# Patient Record
Sex: Female | Born: 1955 | Race: White | Hispanic: No | Marital: Married | Smoking: Former smoker
Health system: Southern US, Community
[De-identification: ages and names within clinical notes are randomized; demographics above are authoritative.]

## PROBLEM LIST (undated history)

## (undated) DIAGNOSIS — R42 Dizziness and giddiness: Secondary | ICD-10-CM

## (undated) DIAGNOSIS — M1990 Unspecified osteoarthritis, unspecified site: Secondary | ICD-10-CM

## (undated) HISTORY — PX: DILATION AND CURETTAGE OF UTERUS: SHX78

## (undated) HISTORY — PX: APPENDECTOMY: SHX54

---

## 2004-08-22 ENCOUNTER — Emergency Department: Payer: Self-pay | Admitting: Emergency Medicine

## 2006-06-22 ENCOUNTER — Ambulatory Visit: Payer: Self-pay | Admitting: Nurse Practitioner

## 2007-07-02 ENCOUNTER — Ambulatory Visit: Payer: Self-pay | Admitting: Surgery

## 2008-11-23 ENCOUNTER — Emergency Department: Payer: Self-pay | Admitting: Emergency Medicine

## 2009-12-15 ENCOUNTER — Inpatient Hospital Stay: Payer: Self-pay | Admitting: Surgery

## 2015-03-06 ENCOUNTER — Emergency Department
Admission: EM | Admit: 2015-03-06 | Discharge: 2015-03-06 | Disposition: A | Discharge status: Home / Self Care | Payer: Self-pay | Attending: Emergency Medicine | Admitting: Emergency Medicine

## 2015-03-06 ENCOUNTER — Other Ambulatory Visit: Payer: Self-pay

## 2015-03-06 ENCOUNTER — Encounter: Payer: Self-pay | Admitting: Emergency Medicine

## 2015-03-06 DIAGNOSIS — M549 Dorsalgia, unspecified: Secondary | ICD-10-CM | POA: Insufficient documentation

## 2015-03-06 DIAGNOSIS — M5412 Radiculopathy, cervical region: Secondary | ICD-10-CM | POA: Insufficient documentation

## 2015-03-06 DIAGNOSIS — Z87891 Personal history of nicotine dependence: Secondary | ICD-10-CM | POA: Insufficient documentation

## 2015-03-06 DIAGNOSIS — R200 Anesthesia of skin: Secondary | ICD-10-CM | POA: Insufficient documentation

## 2015-03-06 LAB — TROPONIN I: Troponin I: <0.03 ng/mL (ref <0.031)

## 2015-03-06 MED ORDER — DEXAMETHASONE SODIUM PHOSPHATE 10 MG/ML IJ SOLN
10.0000 mg | Freq: Once | INTRAMUSCULAR | Status: AC
  Administered 2015-03-06: 10 mg via INTRAMUSCULAR

## 2015-03-06 MED ORDER — OXYCODONE-ACETAMINOPHEN 5-325 MG PO TABS: 1.0000 | ORAL_TABLET | Freq: Four times a day (QID) | ORAL | Status: DC | PRN

## 2015-03-06 MED ORDER — KETOROLAC TROMETHAMINE 30 MG/ML IJ SOLN
INTRAMUSCULAR | Status: AC
  Administered 2015-03-06: 30 mg via INTRAMUSCULAR
  Filled 2015-03-06: qty 1

## 2015-03-06 MED ORDER — PREDNISONE 10 MG PO TABS: 10.0000 mg | ORAL_TABLET | Freq: Every day | ORAL | Status: DC

## 2015-03-06 MED ORDER — KETOROLAC TROMETHAMINE 30 MG/ML IJ SOLN
30.0000 mg | Freq: Once | INTRAMUSCULAR | Status: AC
  Administered 2015-03-06: 30 mg via INTRAMUSCULAR

## 2015-03-06 MED ORDER — DEXAMETHASONE SODIUM PHOSPHATE 10 MG/ML IJ SOLN
INTRAMUSCULAR | Status: AC
Start: 2015-03-06 — End: 2015-03-06
  Administered 2015-03-06: 10 mg via INTRAMUSCULAR
  Filled 2015-03-06: qty 1

## 2015-03-06 NOTE — ED Provider Notes (Signed)
CSN: [id number]     Arrival date & time 03/06/15  2044 History   First MD Initiated Contact with Patient 03/06/15 2222     Chief Complaint  Patient presents with  . Back Pain  . Shoulder Pain      HPI  59 year old female with 1 month of left shoulder pain. She describes pain numbness and tingling radiating from the left shoulder blade down the left arm and into the left hand. Pain is constant and increased with activity. She notices increased pain with cervical extension. He cervical flexion. Pain is moderate. She has tried ibuprofen, Percocet with no relief. Denies any trauma injury or weakness in the right upper extremity. No relief with heating pad.  No past medical history on file. Past Surgical History  Procedure Laterality Date  . Appendectomy     No family history on file. History  Substance Use Topics  . Smoking status: Former Games developermoker  . Smokeless tobacco: Not on file  . Alcohol Use: No   OB History    No data available     Review of Systems  Constitutional: Negative for fever, chills, activity change, appetite change and fatigue.  HENT: Negative for congestion, facial swelling, mouth sores, nosebleeds, rhinorrhea, sinus pressure, sore throat and trouble swallowing.   Eyes: Negative for discharge, redness and visual disturbance.  Respiratory: Negative for cough, chest tightness, shortness of breath, wheezing and stridor.   Cardiovascular: Negative for chest pain and leg swelling.  Gastrointestinal: Negative for nausea, vomiting, abdominal pain and diarrhea.  Genitourinary: Negative for dysuria, flank pain and difficulty urinating.  Musculoskeletal: Negative for arthralgias and gait problem.  Neurological: Positive for numbness (left upper extremity, all 5 digits, unable to specify a specific nerve distribution). Negative for weakness and headaches.  Hematological: Negative for adenopathy.  Psychiatric/Behavioral: Negative for behavioral problems, confusion and  agitation.      Allergies  Review of patient's allergies indicates no known allergies.  Home Medications   Prior to Admission medications   Medication Sig Start Date End Date Taking? Authorizing Provider  oxyCODONE-acetaminophen (ROXICET) 5-325 MG per tablet Take 1-2 tablets by mouth every 6 (six) hours as needed for severe pain. 03/06/15   Evon Slackhomas C Gaines, PA-C  predniSONE (DELTASONE) 10 MG tablet Take 1 tablet (10 mg total) by mouth daily. 03/06/15   Evon Slackhomas C Gaines, PA-C   BP 173/95 mmHg  Pulse 81  Temp(Src) 97.7 F (36.5 C) (Oral)  Resp 14  Ht 6\' 6"  (1.981 m)  SpO2 100% Physical Exam  Constitutional: She is oriented to person, place, and time. She appears well-developed and well-nourished.  HENT:  Head: Normocephalic and atraumatic.  Eyes: EOM are normal. Pupils are equal, round, and reactive to light.  Neck: Normal range of motion. Neck supple.  Positive Spurling's test to the left  Cardiovascular: Normal rate, regular rhythm and normal heart sounds.   Pulmonary/Chest: Effort normal and breath sounds normal. No respiratory distress.  Abdominal: Soft. She exhibits no distension.  Musculoskeletal: Normal range of motion. She exhibits no edema or tenderness.  Patient with normal range of motion of left upper extremity at the shoulder or elbow wrist and digits. She had 5 out 5 strength with biceps, triceps and grip strength. She is neurovascularly intact to left upper extremity. Negative Tinel's and Phalen sign at the wrist.  Lymphadenopathy:    She has no cervical adenopathy.  Neurological: She is alert and oriented to person, place, and time.  Skin: Skin is warm and dry.  Psychiatric: She has a normal mood and affect. Her behavior is normal.    ED Course  Procedures (including critical care time) Labs Review Labs Reviewed  TROPONIN I    Imaging Review No results found.   ED ECG REPORT I, KAMINSKI,DAVID W, the attending physician, personally viewed and interpreted  this ECG.  Date: 03/06/2015 EKG Time: 2106 Rate: 79 Rhythm: Normal sinus rhythm with one premature beat. Axis: 35 Intervals: Within normal limits ST&T Change: None noted  MDM   Final diagnoses:  Acute cervical radiculopathy    Patient with 1 month of left shoulder blade pain, pain numbness tingling and burning sensation throughout the left upper extremity. She has a positive Spurling's test to the left. No Neurological deficits are noted. Chest tried oral NSAIDs with no relief. He was given an dexamethasone injection 10 mg, Toradol injection 30 mg. He was sent home with oxycodone quantity #25, prednisone 6 day taper. She'll follow up with orthopedics in no relief. Patient educated on red flags follow-up for any worsening symptoms or urgency changes in her health.    Evon Slackhomas C Gaines, PA-C 03/06/15 2252  Darien Ramusavid W Kaminski, MD 03/06/15 [id number]

## 2015-03-06 NOTE — Discharge Instructions (Signed)

## 2015-03-06 NOTE — ED Notes (Addendum)
Pt states left posterior shoulder pain that radiates to upper back for one month. Pt states pain is worse this last day. Pt states was nauseated "a couple of days ago after i took someone's pain pill". Pt also complains of tingling and pain in left upper extremity. Pt denies chest pain, shob, dizziness, diaphoresis. Pain is worse with movement and palpation.

## 2015-03-06 NOTE — ED Notes (Signed)
Pt med hold until 2345.

## 2015-03-06 NOTE — ED Provider Notes (Signed)
ED ECG REPORT I, Breeona Waid W, the attending physician, personally viewed and interpreted this ECG.   Date: 03/06/2015  EKG Time: 2106  Rate: 79  Rhythm: Normal sinus rhythm with one premature beat.  Axis: 35  Intervals: Within normal limits  ST&T Change: None noted   Darien Ramusavid W Ewel Lona, MD 03/06/15 2237

## 2015-04-20 ENCOUNTER — Emergency Department
Admission: EM | Admit: 2015-04-20 | Discharge: 2015-04-20 | Disposition: A | Discharge status: Home / Self Care | Payer: Self-pay | Attending: Emergency Medicine | Admitting: Emergency Medicine

## 2015-04-20 DIAGNOSIS — M79609 Pain in unspecified limb: Secondary | ICD-10-CM

## 2015-04-20 DIAGNOSIS — R202 Paresthesia of skin: Secondary | ICD-10-CM | POA: Insufficient documentation

## 2015-04-20 DIAGNOSIS — M5412 Radiculopathy, cervical region: Secondary | ICD-10-CM | POA: Insufficient documentation

## 2015-04-20 DIAGNOSIS — Z87891 Personal history of nicotine dependence: Secondary | ICD-10-CM | POA: Insufficient documentation

## 2015-04-20 MED ORDER — DIAZEPAM 2 MG PO TABS: 2.0000 mg | ORAL_TABLET | Freq: Three times a day (TID) | ORAL | Status: DC | PRN

## 2015-04-20 MED ORDER — OXYCODONE-ACETAMINOPHEN 5-325 MG PO TABS: 1.0000 | ORAL_TABLET | Freq: Three times a day (TID) | ORAL | Status: DC | PRN

## 2015-04-20 MED ORDER — PREDNISONE 10 MG PO TABS: 10.0000 mg | ORAL_TABLET | Freq: Two times a day (BID) | ORAL | Status: DC

## 2015-04-20 NOTE — ED Notes (Signed)
Pt c/o left shoulder pain that radiates into the hand with tingling for almost 2 months, was seen here in the ED and dx with radiculopathy and referred to ortho, states she has not called for follow up

## 2015-04-20 NOTE — ED Provider Notes (Signed)
[facility] Emergency Department Provider Note ____________________________________________  Time seen: 2022  I have reviewed the triage vital signs and the nursing notes.  HISTORY  Chief Complaint  Shoulder Pain  HPI Felicia Dunn is a 59 y.o. female, right hand dominant, who reports to the ED with continued pain in the left upper extremity. She was last evaluated here about 6 weeks ago, and diagnosed with a cervical radiculopathy on the left side. She was referred to Dr. Rexanne Mano, but has failed to follow-up in the interim. She has since completed her previous prednisone and Percocet prescriptions. She denies reinjury, trauma, or weakness. She localizes pain just at the base of the neck just medial to the shoulder blade on the left with referral into the upper extremity past the elbow and into the fingers. She is claiming that she needs to know what's wrong because the last provider failed to do any x-rays. She rates her pain at a 10 out of 10 in triage.  History reviewed. No pertinent past medical history.  There are no active problems to display for this patient.  Past Surgical History  Procedure Laterality Date  . Appendectomy      Current Outpatient Rx  Name  Route  Sig  Dispense  Refill  . diazepam (VALIUM) 2 MG tablet   Oral   Take 1 tablet (2 mg total) by mouth every 8 (eight) hours as needed for muscle spasms.   10 tablet   0   . oxyCODONE-acetaminophen (ROXICET) 5-325 MG per tablet   Oral   Take 1 tablet by mouth every 8 (eight) hours as needed.   21 tablet   0   . predniSONE (DELTASONE) 10 MG tablet   Oral   Take 1 tablet (10 mg total) by mouth 2 (two) times daily with a meal.   14 tablet   0     Allergies Review of patient's allergies indicates no known allergies.  No family history on file.  Social History History  Substance Use Topics  . Smoking status: Former Games developer  . Smokeless tobacco: Never Used  . Alcohol Use: No     Review of Systems  Constitutional: Negative for fever. Eyes: Negative for visual changes. ENT: Negative for sore throat. Cardiovascular: Negative for chest pain. Respiratory: Negative for shortness of breath. Gastrointestinal: Negative for abdominal pain, vomiting and diarrhea. Genitourinary: Negative for dysuria. Musculoskeletal: Negative for back pain. Skin: Negative for rash. Neurological: Negative for headaches or focal weakness. Reports numbness as above. ____________________________________________  PHYSICAL EXAM:  VITAL SIGNS: ED Triage Vitals  Enc Vitals Group     BP 04/20/15 1852 158/83 mmHg     Pulse Rate 04/20/15 1852 88     Resp 04/20/15 1852 18     Temp 04/20/15 1852 98.2 F (36.8 C)     Temp Source 04/20/15 1852 Oral     SpO2 04/20/15 1852 96 %     Weight --      Height 04/20/15 1852  (1.981 m)     Head Cir --      Peak Flow --      Pain Score 04/20/15 1853 8     Pain Loc --      Pain Edu? --      Excl. in GC? --    Constitutional: Alert and oriented. Well appearing and in no distress. Eyes: Conjunctivae are normal. PERRL. Normal extraocular movements. ENT   Head: Normocephalic and atraumatic.   Nose: No congestion/rhinnorhea.  Mouth/Throat: Mucous membranes are moist.   Neck: Supple. No thyromegaly. Hematological/Lymphatic/Immunilogical: No cervical lymphadenopathy. Cardiovascular: Normal rate, regular rhythm.  Respiratory: Normal respiratory effort.  Musculoskeletal: Nontender with normal range of motion in all extremities. Fulll UE ROM without difficulty. Neurologic:  Normal gait without ataxia. Normal speech and language. No gross focal neurologic deficits are appreciated. CN II-XII grossly intact. Normal grip, intrinsic, and opposition testing.  Skin:  Skin is warm, dry and intact. No rash noted. Psychiatric: Mood and affect are normal. Patient exhibits appropriate insight and  judgment. ____________________________________________  INITIAL IMPRESSION / ASSESSMENT AND PLAN / ED COURSE  Discussed the need to see ortho for definitive treatment of her clinical presentation of cervical radicular pain. Refills of Percocet #21 and Prednisone BID #14 given. Prescription Valium 2 mg #10 given for muscle pain & spasms.  ____________________________________________  FINAL CLINICAL IMPRESSION(S) / ED DIAGNOSES  Final diagnoses:  Cervical radicular pain  Paresthesia and pain of left extremity     Lissa HoardJenise V Bacon Neya Creegan, PA-C 04/20/15 2054  Darien Ramusavid W Kaminski, MD 04/20/15 [id number]

## 2015-04-20 NOTE — Discharge Instructions (Signed)
Cervical Radiculopathy Cervical radiculopathy means a nerve in the neck is pinched or bruised. This can cause pain or loss of feeling (numbness) that runs from your neck to your arm and fingers. HOME CARE   Put ice on the injured or painful area.  Put ice in a plastic bag.  Place a towel between your skin and the bag.  Leave the ice on for 15-20 minutes, 03-04 times a day, or as told by your doctor.  If ice does not help, you can try using heat. Take a warm shower or bath, or use a hot water bottle as told by your doctor.  You may try a gentle neck and shoulder massage.  Use a flat pillow when you sleep.  Only take medicines as told by your doctor.  Keep all physical therapy visits as told by your doctor.  If you are given a soft collar, wear it as told by your doctor. GET HELP RIGHT AWAY IF:   Your pain gets worse and is not controlled with medicine.  You lose feeling or feel weak in your hand, arm, face, or leg.  You have a fever or stiff neck.  You cannot control when you poop or pee (incontinence).  You have trouble with walking, balance, or speaking. MAKE SURE YOU:   Understand these instructions.  Will watch your condition.  Will get help right away if you are not doing well or get worse. Document Released: 09/22/2011 Document Revised: 12/26/2011 Document Reviewed: 09/22/2011 [facility] Patient Information 2015 [place], [place]. This information is not intended to replace advice given to you by your health care provider. Make sure you discuss any questions you have with your health care provider.   Radicular Pain Radicular pain in either the arm or leg is usually from a bulging or herniated disk in the spine. A piece of the herniated disk may press against the nerves as the nerves exit the spine. This causes pain which is felt at the tips of the nerves down the arm or leg. Other causes of radicular pain may include:  Fractures.  Heart disease.  Cancer.  An  abnormal and usually degenerative state of the nervous system or nerves (neuropathy). Diagnosis may require CT or MRI scanning to determine the primary cause.  Nerves that start at the neck (nerve roots) may cause radicular pain in the outer shoulder and arm. It can spread down to the thumb and fingers. The symptoms vary depending on which nerve root has been affected. In most cases radicular pain improves with conservative treatment. Neck problems may require physical therapy, a neck collar, or cervical traction. Treatment may take many weeks, and surgery may be considered if the symptoms do not improve.  Conservative treatment is also recommended for sciatica. Sciatica causes pain to radiate from the lower back or buttock area down the leg into the foot. Often there is a history of back problems. Most patients with sciatica are better after 2 to 4 weeks of rest and other supportive care. Short term bed rest can reduce the disk pressure considerably. Sitting, however, is not a good position since this increases the pressure on the disk. You should avoid bending, lifting, and all other activities which make the problem worse. Traction can be used in severe cases. Surgery is usually reserved for patients who do not improve within the first months of treatment. Only take over-the-counter or prescription medicines for pain, discomfort, or fever as directed by your caregiver. Narcotics and muscle relaxants may help  by relieving more severe pain and spasm and by providing mild sedation. Cold or massage can give significant relief. Spinal manipulation is not recommended. It can increase the degree of disc protrusion. Epidural steroid injections are often effective treatment for radicular pain. These injections deliver medicine to the spinal nerve in the space between the protective covering of the spinal cord and back bones (vertebrae). Your caregiver can give you more information about steroid injections. These  injections are most effective when given within two weeks of the onset of pain.  You should see your caregiver for follow up care as recommended. A program for neck and back injury rehabilitation with stretching and strengthening exercises is an important part of management.  SEEK IMMEDIATE MEDICAL CARE IF:  You develop increased pain, weakness, or numbness in your arm or leg.  You develop difficulty with bladder or bowel control.  You develop abdominal pain. Document Released: 11/10/2004 Document Revised: 12/26/2011 Document Reviewed: 01/26/2009 [facility] Patient Information 2015 [place]. This information is not intended to replace advice given to you by your health care provider. Make sure you discuss any questions you have with your health care provider.  Take the prescription meds as directed.  [facility]  You should see Dr. Rosita KeaMenz for definitive treatment of your pinched nerves.

## 2016-09-05 ENCOUNTER — Encounter: Payer: Self-pay | Admitting: *Deleted

## 2016-09-06 NOTE — Discharge Instructions (Signed)
Cataract Surgery, Care After °Refer to this sheet in the next few weeks. These instructions provide you with information about caring for yourself after your procedure. Your health care provider may also give you more specific instructions. Your treatment has been planned according to current medical practices, but problems sometimes occur. Call your health care provider if you have any problems or questions after your procedure. °What can I expect after the procedure? °After the procedure, it is common to have: °· Itching. °· Discomfort. °· Fluid discharge. °· Sensitivity to light and to touch. °· Bruising. °Follow these instructions at home: °Eye Care  °· Check your eye every day for signs of infection. Watch for: °¨ Redness, swelling, or pain. °¨ Fluid, blood, or pus. °¨ Warmth. °¨ Bad smell. °Activity  °· Avoid strenuous activities, such as playing contact sports, for as long as told by your health care provider. °· Do not drive or operate heavy machinery until your health care provider approves. °· Do not bend or lift heavy objects . Bending increases pressure in the eye. You can walk, climb stairs, and do light household chores. °· Ask your health care provider when you can return to work. If you work in a dusty environment, you may be advised to wear protective eyewear for a period of time. °General instructions  °· Take or apply over-the-counter and prescription medicines only as told by your health care provider. This includes eye drops. °· Do not touch or rub your eyes. °· If you were given a protective shield, wear it as told by your health care provider. If you were not given a protective shield, wear sunglasses as told by your health care provider to protect your eyes. °· Keep the area around your eye clean and dry. Avoid swimming or allowing water to hit you directly in the face while showering until told by your health care provider. Keep soap and shampoo out of your eyes. °· Do not put a contact lens  into the affected eye or eyes until your health care provider approves. °· Keep all follow-up visits as told by your health care provider. This is important. °Contact a health care provider if: ° °· You have increased bruising around your eye. °· You have pain that is not helped with medicine. °· You have a fever. °· You have redness, swelling, or pain in your eye. °· You have fluid, blood, or pus coming from your incision. °· Your vision gets worse. °Get help right away if: °· You have sudden vision loss. °This information is not intended to replace advice given to you by your health care provider. Make sure you discuss any questions you have with your health care provider. °Document Released: 04/22/2005 Document Revised: 02/11/2016 Document Reviewed: 08/13/2015 °Elsevier Interactive Patient Education © 2017 [employer] Inc[employer] ° ° ° ° °General Anesthesia, Adult, Care After °These instructions provide you with information about caring for yourself after your procedure. Your health care provider may also give you more specific instructions. Your treatment has been planned according to current medical practices, but problems sometimes occur. Call your health care provider if you have any problems or questions after your procedure. °What can I expect after the procedure? °After the procedure, it is common to have: °· Vomiting. °· A sore throat. °· Mental slowness. °It is common to feel: °· Nauseous. °· Cold or shivery. °· Sleepy. °· Tired. °· Sore or achy, even in parts of your body where you did not have surgery. °Follow these instructions at   home: °For at least 24 hours after the procedure:  °· Do not: °¨ Participate in activities where you could fall or become injured. °¨ Drive. °¨ Use heavy machinery. °¨ Drink alcohol. °¨ Take sleeping pills or medicines that cause drowsiness. °¨ Make important decisions or sign legal documents. °¨ Take care of children on your own. °· Rest. °Eating and drinking  °· If you vomit, drink  water, juice, or soup when you can drink without vomiting. °· Drink enough fluid to keep your urine clear or pale yellow. °· Make sure you have little or no nausea before eating solid foods. °· Follow the diet recommended by your health care provider. °General instructions  °· Have a responsible adult stay with you until you are awake and alert. °· Return to your normal activities as told by your health care provider. Ask your health care provider what activities are safe for you. °· Take over-the-counter and prescription medicines only as told by your health care provider. °· If you smoke, do not smoke without supervision. °· Keep all follow-up visits as told by your health care provider. This is important. °Contact a health care provider if: °· You continue to have nausea or vomiting at home, and medicines are not helpful. °· You cannot drink fluids or start eating again. °· You cannot urinate after 8-12 hours. °· You develop a skin rash. °· You have fever. °· You have increasing redness at the site of your procedure. °Get help right away if: °· You have difficulty breathing. °· You have chest pain. °· You have unexpected bleeding. °· You feel that you are having a life-threatening or urgent problem. °This information is not intended to replace advice given to you by your health care provider. Make sure you discuss any questions you have with your health care provider. °Document Released: 01/09/2001 Document Revised: 03/07/2016 Document Reviewed: 09/17/2015 °Elsevier Interactive Patient Education © 2017 Elsevier Inc. ° °

## 2016-09-13 ENCOUNTER — Ambulatory Visit: Payer: Self-pay | Admitting: Anesthesiology

## 2016-09-13 ENCOUNTER — Ambulatory Visit
Admission: RE | Admit: 2016-09-13 | Discharge: 2016-09-13 | Disposition: A | Discharge status: Home / Self Care | Payer: Self-pay | Source: Ambulatory Visit | Attending: Ophthalmology | Admitting: Ophthalmology

## 2016-09-13 ENCOUNTER — Encounter
Admission: RE | Disposition: A | Discharge status: Home / Self Care | Payer: Self-pay | Source: Ambulatory Visit | Attending: Ophthalmology

## 2016-09-13 DIAGNOSIS — Z87891 Personal history of nicotine dependence: Secondary | ICD-10-CM | POA: Insufficient documentation

## 2016-09-13 DIAGNOSIS — H2512 Age-related nuclear cataract, left eye: Secondary | ICD-10-CM | POA: Insufficient documentation

## 2016-09-13 DIAGNOSIS — M1990 Unspecified osteoarthritis, unspecified site: Secondary | ICD-10-CM | POA: Insufficient documentation

## 2016-09-13 HISTORY — PX: CATARACT EXTRACTION W/PHACO: SHX586

## 2016-09-13 HISTORY — DX: Unspecified osteoarthritis, unspecified site: M19.90

## 2016-09-13 HISTORY — DX: Dizziness and giddiness: R42

## 2016-09-13 SURGERY — PHACOEMULSIFICATION, CATARACT, WITH IOL INSERTION
Anesthesia: Monitor Anesthesia Care | Laterality: Left | Wound class: Clean

## 2016-09-13 MED ORDER — MOXIFLOXACIN HCL 0.5 % OP SOLN
OPHTHALMIC | Status: DC | PRN
  Administered 2016-09-13: 1 [drp] via OPHTHALMIC

## 2016-09-13 MED ORDER — SODIUM HYALURONATE 10 MG/ML IO SOLN
INTRAOCULAR | Status: DC | PRN
  Administered 2016-09-13: 0.85 mL via INTRAOCULAR

## 2016-09-13 MED ORDER — FENTANYL CITRATE (PF) 100 MCG/2ML IJ SOLN
INTRAMUSCULAR | Status: DC | PRN
  Administered 2016-09-13 (×2): 50 ug via INTRAVENOUS

## 2016-09-13 MED ORDER — EPINEPHRINE PF 1 MG/ML IJ SOLN
INTRAOCULAR | Status: DC | PRN
  Administered 2016-09-13: 111 mL via OPHTHALMIC

## 2016-09-13 MED ORDER — LIDOCAINE HCL (PF) 4 % IJ SOLN
INTRAOCULAR | Status: DC | PRN
  Administered 2016-09-13: 1 mL via OPHTHALMIC

## 2016-09-13 MED ORDER — ARMC OPHTHALMIC DILATING DROPS
1.0000 "application " | OPHTHALMIC | Status: DC | PRN
  Administered 2016-09-13 (×3): 1 via OPHTHALMIC

## 2016-09-13 MED ORDER — SODIUM HYALURONATE 23 MG/ML IO SOLN
INTRAOCULAR | Status: DC | PRN
  Administered 2016-09-13: 0.6 mL via INTRAOCULAR

## 2016-09-13 MED ORDER — MIDAZOLAM HCL 2 MG/2ML IJ SOLN
INTRAMUSCULAR | Status: DC | PRN
  Administered 2016-09-13: 2 mg via INTRAVENOUS

## 2016-09-13 SURGICAL SUPPLY — 19 items
CANNULA ANT/CHMB 27GA (MISCELLANEOUS) ×2 IMPLANT
CUP MEDICINE 2OZ PLAST GRAD ST (MISCELLANEOUS) ×2 IMPLANT
DISSECTOR HYDRO NUCLEUS 50X22 (MISCELLANEOUS) ×2 IMPLANT
GLOVE BIO SURGEON STRL SZ8 (GLOVE) ×2 IMPLANT
GLOVE SURG LX 7.5 STRW (GLOVE) ×1
GLOVE SURG LX STRL 7.5 STRW (GLOVE) ×1 IMPLANT
GOWN STRL REUS W/ TWL LRG LVL3 (GOWN DISPOSABLE) ×2 IMPLANT
GOWN STRL REUS W/TWL LRG LVL3 (GOWN DISPOSABLE) ×2
LENS IOL TECNIS SYMFONY 19.0 (Intraocular Lens) ×2 IMPLANT
MARKER SKIN DUAL TIP RULER LAB (MISCELLANEOUS) ×2 IMPLANT
PACK CATARACT (MISCELLANEOUS) ×2 IMPLANT
PACK CATARACT BRASINGTON (MISCELLANEOUS) ×2 IMPLANT
PACK EYE AFTER SURG (MISCELLANEOUS) ×2 IMPLANT
SOL PREP PVP 2OZ (MISCELLANEOUS) ×2
SOLUTION PREP PVP 2OZ (MISCELLANEOUS) ×1 IMPLANT
SYR 3ML LL SCALE MARK (SYRINGE) ×2 IMPLANT
SYR TB 1ML LUER SLIP (SYRINGE) ×2 IMPLANT
WATER STERILE IRR 250ML POUR (IV SOLUTION) ×2 IMPLANT
WIPE NON LINTING 3.25X3.25 (MISCELLANEOUS) ×2 IMPLANT

## 2016-09-13 NOTE — Op Note (Signed)
OPERATIVE NOTE  Felicia Dunn [id number] 09/13/2016   PREOPERATIVE DIAGNOSIS:  Nuclear sclerotic cataract left eye.  H25.12   POSTOPERATIVE DIAGNOSIS:    Nuclear sclerotic cataract left eye.     PROCEDURE:  Phacoemusification with posterior chamber intraocular lens placement of the left eye   LENS:   Implant Name Type Inv. Item Serial No. Manufacturer Lot No. LRB No. Used  Symfony IOL lens     [id number] [employer]   Left 1       [id number] +19.0 D multifocal IOL   ULTRASOUND TIME: 0 minutes 28 seconds.  CDE 4.13   SURGEON:  Willey BladeBradley Shonn Farruggia, MD, MPH   ANESTHESIA:  Topical with tetracaine drops augmented with 1% preservative-free intracameral lidocaine.   COMPLICATIONS:  None.   DESCRIPTION OF PROCEDURE:  The patient was identified in the holding room and transported to the operating room and placed in the supine position under the operating microscope.  The left eye was identified as the operative eye and it was prepped and draped in the usual sterile ophthalmic fashion.   A 1.0 millimeter clear-corneal paracentesis was made at the 5:00 position. 0.5 ml of preservative-free 1% lidocaine with epinephrine was injected into the anterior chamber.  The anterior chamber was filled with Healon 5 viscoelastic.  A 2.4 millimeter keratome was used to make a near-clear corneal incision at the 2:00 position.  A curvilinear capsulorrhexis was made with a cystotome and capsulorrhexis forceps.  Balanced salt solution was used to hydrodissect and hydrodelineate the nucleus.   Phacoemulsification was then used in stop and chop fashion to remove the lens nucleus and epinucleus.  The remaining cortex was then removed using the irrigation and aspiration handpiece. Healon was then placed into the capsular bag to distend it for lens placement.  A lens was then injected into the capsular bag.  The remaining viscoelastic was aspirated.     A small plastic piece of the cartridge was noted in the anterior  chamber.  Healon 5 was injected into the eye and this was removed with Elenore PaddyKellman McPherson forcepts.  The healon was aspirated with the I/A tip.    Wounds were hydrated with balanced salt solution.  The anterior chamber was inflated to a physiologic pressure with balanced salt solution.   Intracameral vigamox 0.1 mL undiltued was injected into the eye and a drop placed onto the ocular surface.  No wound leaks were noted.  The patient was taken to the recovery room in stable condition without complications of anesthesia or surgery  Willey BladeBradley Hildagarde Holleran 09/13/2016, 1:24 PM

## 2016-09-13 NOTE — H&P (Signed)
The History and Physical notes are on paper, have been signed, and are to be scanned. The patient remains stable and unchanged from the H&P.   Previous H&P reviewed, patient examined, and there are no changes.  Felicia Dunn 09/13/2016 12:04 PM

## 2016-09-13 NOTE — Anesthesia Preprocedure Evaluation (Signed)
Anesthesia Evaluation  Patient identified by MRN, date of birth, ID band Patient awake    Reviewed: Allergy & Precautions, H&P , NPO status , Patient's Chart, lab work & pertinent test results, reviewed documented beta blocker date and time   Airway Mallampati: II  TM Distance: >3 FB Neck ROM: full    Dental no notable dental hx.    Pulmonary neg pulmonary ROS, former smoker,    Pulmonary exam normal breath sounds clear to auscultation       Cardiovascular Exercise Tolerance: Good negative cardio ROS   Rhythm:regular Rate:Normal     Neuro/Psych negative neurological ROS  negative psych ROS   GI/Hepatic negative GI ROS, Neg liver ROS,   Endo/Other  negative endocrine ROS  Renal/GU negative Renal ROS  negative genitourinary   Musculoskeletal  (+) Arthritis ,   Abdominal   Peds  Hematology negative hematology ROS (+)   Anesthesia Other Findings   Reproductive/Obstetrics negative OB ROS                             Anesthesia Physical Anesthesia Plan  ASA: I  Anesthesia Plan: MAC   Post-op Pain Management:    Induction:   Airway Management Planned:   Additional Equipment:   Intra-op Plan:   Post-operative Plan:   Informed Consent: I have reviewed the patients History and Physical, chart, labs and discussed the procedure including the risks, benefits and alternatives for the proposed anesthesia with the patient or authorized representative who has indicated his/her understanding and acceptance.   Dental Advisory Given  Plan Discussed with: CRNA  Anesthesia Plan Comments:         Anesthesia Quick Evaluation

## 2016-09-13 NOTE — Transfer of Care (Signed)
Immediate Anesthesia Transfer of Care Note  Patient: Felicia JeffersonKaren A Baquero  Procedure(s) Performed: Procedure(s) with comments: CATARACT EXTRACTION PHACO AND INTRAOCULAR LENS PLACEMENT (IOC) (Left) - SYMFONY TORIC LENS LEFT  Patient Location: PACU  Anesthesia Type: MAC  Level of Consciousness: awake, alert  and patient cooperative  Airway and Oxygen Therapy: Patient Spontanous Breathing and Patient connected to supplemental oxygen  Post-op Assessment: Post-op Vital signs reviewed, Patient's Cardiovascular Status Stable, Respiratory Function Stable, Patent Airway and No signs of Nausea or vomiting  Post-op Vital Signs: Reviewed and stable  Complications: No apparent anesthesia complications

## 2016-09-13 NOTE — Anesthesia Postprocedure Evaluation (Signed)
Anesthesia Post Note  Patient: Peri JeffersonKaren A Rommel  Procedure(s) Performed: Procedure(s) (LRB): CATARACT EXTRACTION PHACO AND INTRAOCULAR LENS PLACEMENT (IOC) (Left)  Patient location during evaluation: PACU Anesthesia Type: MAC Level of consciousness: awake and alert Pain management: pain level controlled Vital Signs Assessment: post-procedure vital signs reviewed and stable Respiratory status: spontaneous breathing, nonlabored ventilation, respiratory function stable and patient connected to nasal cannula oxygen Cardiovascular status: stable and blood pressure returned to baseline Anesthetic complications: no    Scarlette Sliceachel B Earnestine Shipp

## 2016-09-13 NOTE — Anesthesia Procedure Notes (Signed)
Procedure Name: MAC Performed by: Kamarii Carton Pre-anesthesia Checklist: Patient identified, Emergency Drugs available, Suction available, Timeout performed and Patient being monitored Patient Re-evaluated:Patient Re-evaluated prior to inductionOxygen Delivery Method: Nasal cannula Placement Confirmation: positive ETCO2     

## 2016-09-14 ENCOUNTER — Encounter: Payer: Self-pay | Admitting: Ophthalmology

## 2020-12-21 ENCOUNTER — Other Ambulatory Visit: Payer: Self-pay | Admitting: Infectious Diseases

## 2020-12-21 DIAGNOSIS — M545 Low back pain, unspecified: Secondary | ICD-10-CM | POA: Insufficient documentation

## 2020-12-21 DIAGNOSIS — M79604 Pain in right leg: Secondary | ICD-10-CM

## 2020-12-21 DIAGNOSIS — G8929 Other chronic pain: Secondary | ICD-10-CM | POA: Insufficient documentation

## 2020-12-25 ENCOUNTER — Encounter: Payer: Self-pay | Admitting: *Deleted

## 2020-12-25 DIAGNOSIS — Z122 Encounter for screening for malignant neoplasm of respiratory organs: Secondary | ICD-10-CM

## 2020-12-25 DIAGNOSIS — Z87891 Personal history of nicotine dependence: Secondary | ICD-10-CM

## 2020-12-25 NOTE — Telephone Encounter (Signed)
Received referral for initial lung cancer screening scan. Contacted patient and obtained smoking history,(former, quit 2014, 64.5 pack year) as well as answering questions related to screening process. Patient denies signs of lung cancer such as weight loss or hemoptysis. Patient denies comorbidity that would prevent curative treatment if lung cancer were found. Patient is scheduled for shared decision making visit and CT scan on 01/05/21 at 145pm.

## 2020-12-30 ENCOUNTER — Ambulatory Visit
Admission: RE | Admit: 2020-12-30 | Discharge: 2020-12-30 | Disposition: A | Discharge status: Home / Self Care | Payer: Medicare HMO | Source: Ambulatory Visit | Attending: Infectious Diseases | Admitting: Infectious Diseases

## 2020-12-30 ENCOUNTER — Other Ambulatory Visit: Payer: Self-pay

## 2020-12-30 DIAGNOSIS — M79604 Pain in right leg: Secondary | ICD-10-CM | POA: Diagnosis present

## 2021-01-05 ENCOUNTER — Inpatient Hospital Stay: Payer: Medicare HMO | Attending: Nurse Practitioner | Admitting: Nurse Practitioner

## 2021-01-05 ENCOUNTER — Other Ambulatory Visit: Payer: Self-pay

## 2021-01-05 ENCOUNTER — Ambulatory Visit
Admission: RE | Admit: 2021-01-05 | Discharge: 2021-01-05 | Disposition: A | Discharge status: Home / Self Care | Payer: Medicare HMO | Source: Ambulatory Visit | Attending: Nurse Practitioner | Admitting: Nurse Practitioner

## 2021-01-05 DIAGNOSIS — Z122 Encounter for screening for malignant neoplasm of respiratory organs: Secondary | ICD-10-CM | POA: Diagnosis present

## 2021-01-05 DIAGNOSIS — Z87891 Personal history of nicotine dependence: Secondary | ICD-10-CM | POA: Diagnosis present

## 2021-01-05 NOTE — Progress Notes (Signed)
Virtual Visit via Video Enabled Telemedicine Note   I connected with Emilie Rutter on 01/05/21 at 1:45 PM EST by video enabled telemedicine visit and verified that I am speaking with the correct person using two identifiers.   I discussed the limitations, risks, security and privacy concerns of performing an evaluation and management service by telemedicine and the availability of in-person appointments. I also discussed with the patient that there may be a patient responsible charge related to this service. The patient expressed understanding and agreed to proceed.   Other persons participating in the visit and their role in the encounter: Burgess Estelle, RN- checking in patient & navigation  Patient's location: [place]  Provider's location: Clinic  Chief Complaint: Low Dose CT Screening  Patient agreed to evaluation by telemedicine to discuss shared decision making for consideration of low dose CT lung cancer screening.    In accordance with CMS guidelines, patient has met eligibility criteria including age, absence of signs or symptoms of lung cancer.  Social History   Tobacco Use  . Smoking status: Former Smoker    Packs/day: 1.50    Years: 43.00    Pack years: 64.50    Types: Cigarettes    Quit date: 12/02/2012    Years since quitting: 8.0  . Smokeless tobacco: Never Used  Substance Use Topics  . Alcohol use: Yes    Comment: 2 glass wine/month     A shared decision-making session was conducted prior to the performance of CT scan. This includes one or more decision aids, includes benefits and harms of screening, follow-up diagnostic testing, over-diagnosis, false positive rate, and total radiation exposure.   Counseling on the importance of adherence to annual lung cancer LDCT screening, impact of co-morbidities, and ability or willingness to undergo diagnosis and treatment is imperative for compliance of the program.   Counseling on the importance of continued  smoking cessation for former smokers; the importance of smoking cessation for current smokers, and information about tobacco cessation interventions have been given to patient including [place] and [employer] [place] programs.   Written order for lung cancer screening with LDCT has been given to the patient and any and all questions have been answered to the best of my abilities.    Yearly follow up will be coordinated by Burgess Estelle, Thoracic Navigator.  I discussed the assessment and treatment plan with the patient. The patient was provided an opportunity to ask questions and all were answered. The patient agreed with the plan and demonstrated an understanding of the instructions.   The patient was advised to call back or seek an in-person evaluation if the symptoms worsen or if the condition fails to improve as anticipated.   I provided 15 minutes of face-to-face video visit time dedicated to the care of this patient on the date of this encounter to include pre-visit review of smoking history, face-to-face time with the patient, and post visit ordering of testing/documentation.   Beckey Rutter, DNP, AGNP-C [place] at [facility] [phone] (clinic)

## 2021-01-06 ENCOUNTER — Other Ambulatory Visit: Payer: Self-pay | Admitting: Student

## 2021-01-06 DIAGNOSIS — M25561 Pain in right knee: Secondary | ICD-10-CM

## 2021-01-06 DIAGNOSIS — S83281A Other tear of lateral meniscus, current injury, right knee, initial encounter: Secondary | ICD-10-CM

## 2021-01-07 ENCOUNTER — Telehealth: Payer: Self-pay | Admitting: *Deleted

## 2021-01-07 LAB — COLOGUARD: COLOGUARD: NEGATIVE

## 2021-01-07 NOTE — Telephone Encounter (Signed)
Notified patient of LDCT lung cancer screening program results with recommendation for 12 month follow up imaging. Also notified of incidental findings noted below and is encouraged to discuss further with PCP who will receive a copy of this note and/or the CT report. Patient verbalizes understanding.    IMPRESSION: 1. Lung-RADS 2, benign appearance or behavior. Continue annual screening with low-dose chest CT without contrast in 12 months. 2. Mild scattered bronchiectasis and mucoid impaction, possibly postinfectious in etiology. 3. Aortic atherosclerosis (ICD10-I70.0). Coronary artery calcification. 4.  Emphysema (ICD10-J43.9).

## 2021-01-13 ENCOUNTER — Other Ambulatory Visit: Payer: Self-pay | Admitting: Orthopedic Surgery

## 2021-01-13 ENCOUNTER — Other Ambulatory Visit: Payer: Self-pay

## 2021-01-13 ENCOUNTER — Other Ambulatory Visit: Payer: Self-pay | Admitting: *Deleted

## 2021-01-13 ENCOUNTER — Ambulatory Visit
Admission: RE | Admit: 2021-01-13 | Discharge: 2021-01-13 | Disposition: A | Discharge status: Home / Self Care | Payer: Medicare HMO | Source: Ambulatory Visit | Attending: Obstetrics and Gynecology | Admitting: Obstetrics and Gynecology

## 2021-01-13 ENCOUNTER — Other Ambulatory Visit: Payer: Self-pay | Admitting: Obstetrics and Gynecology

## 2021-01-13 DIAGNOSIS — Z1231 Encounter for screening mammogram for malignant neoplasm of breast: Secondary | ICD-10-CM

## 2021-01-17 ENCOUNTER — Other Ambulatory Visit: Payer: Self-pay

## 2021-01-17 ENCOUNTER — Ambulatory Visit
Admission: RE | Admit: 2021-01-17 | Discharge: 2021-01-17 | Disposition: A | Discharge status: Home / Self Care | Payer: Medicare HMO | Source: Ambulatory Visit | Attending: Student | Admitting: Student

## 2021-01-17 DIAGNOSIS — M25561 Pain in right knee: Secondary | ICD-10-CM | POA: Insufficient documentation

## 2021-01-17 DIAGNOSIS — S83281A Other tear of lateral meniscus, current injury, right knee, initial encounter: Secondary | ICD-10-CM

## 2021-02-08 DIAGNOSIS — S83271A Complex tear of lateral meniscus, current injury, right knee, initial encounter: Secondary | ICD-10-CM | POA: Insufficient documentation

## 2021-02-08 DIAGNOSIS — M1711 Unilateral primary osteoarthritis, right knee: Secondary | ICD-10-CM | POA: Insufficient documentation

## 2021-04-21 ENCOUNTER — Ambulatory Visit: Payer: Medicare HMO | Admitting: Dermatology

## 2021-12-13 ENCOUNTER — Other Ambulatory Visit: Payer: Self-pay | Admitting: Infectious Diseases

## 2021-12-13 DIAGNOSIS — Z1231 Encounter for screening mammogram for malignant neoplasm of breast: Secondary | ICD-10-CM

## 2022-01-17 ENCOUNTER — Ambulatory Visit
Admission: RE | Admit: 2022-01-17 | Discharge: 2022-01-17 | Disposition: A | Discharge status: Home / Self Care | Payer: Medicare HMO | Source: Ambulatory Visit | Attending: Infectious Diseases | Admitting: Infectious Diseases

## 2022-01-17 DIAGNOSIS — Z1231 Encounter for screening mammogram for malignant neoplasm of breast: Secondary | ICD-10-CM | POA: Insufficient documentation

## 2022-01-21 ENCOUNTER — Telehealth: Payer: Self-pay | Admitting: *Deleted

## 2022-01-21 NOTE — Telephone Encounter (Signed)
LMTC to schedule Yearly Lung CA CT Scan. 

## 2022-11-22 ENCOUNTER — Other Ambulatory Visit: Payer: Self-pay | Admitting: Student

## 2022-11-22 DIAGNOSIS — M76899 Other specified enthesopathies of unspecified lower limb, excluding foot: Secondary | ICD-10-CM

## 2022-11-22 DIAGNOSIS — M25562 Pain in left knee: Secondary | ICD-10-CM

## 2022-11-22 DIAGNOSIS — M25862 Other specified joint disorders, left knee: Secondary | ICD-10-CM

## 2022-12-05 ENCOUNTER — Ambulatory Visit
Admission: RE | Admit: 2022-12-05 | Discharge: 2022-12-05 | Disposition: A | Discharge status: Home / Self Care | Payer: Medicare HMO | Source: Ambulatory Visit | Attending: Student | Admitting: Student

## 2022-12-05 ENCOUNTER — Other Ambulatory Visit: Payer: Self-pay | Admitting: Infectious Diseases

## 2022-12-05 DIAGNOSIS — M25562 Pain in left knee: Secondary | ICD-10-CM

## 2022-12-05 DIAGNOSIS — M25862 Other specified joint disorders, left knee: Secondary | ICD-10-CM

## 2022-12-05 DIAGNOSIS — Z1231 Encounter for screening mammogram for malignant neoplasm of breast: Secondary | ICD-10-CM

## 2022-12-05 DIAGNOSIS — M76899 Other specified enthesopathies of unspecified lower limb, excluding foot: Secondary | ICD-10-CM

## 2023-02-11 ENCOUNTER — Emergency Department: Payer: Medicare HMO

## 2023-02-11 ENCOUNTER — Other Ambulatory Visit: Payer: Self-pay

## 2023-02-11 ENCOUNTER — Encounter: Payer: Self-pay | Admitting: Intensive Care

## 2023-02-11 ENCOUNTER — Emergency Department
Admission: EM | Admit: 2023-02-11 | Discharge: 2023-02-11 | Disposition: A | Discharge status: Home / Self Care | Payer: Medicare HMO | Source: Home / Self Care | Attending: Emergency Medicine | Admitting: Emergency Medicine

## 2023-02-11 DIAGNOSIS — R259 Unspecified abnormal involuntary movements: Secondary | ICD-10-CM | POA: Insufficient documentation

## 2023-02-11 DIAGNOSIS — R7989 Other specified abnormal findings of blood chemistry: Secondary | ICD-10-CM | POA: Insufficient documentation

## 2023-02-11 DIAGNOSIS — G255 Other chorea: Secondary | ICD-10-CM | POA: Diagnosis not present

## 2023-02-11 LAB — AMMONIA: Ammonia: 25 umol/L (ref 9–35)

## 2023-02-11 LAB — URINE DRUG SCREEN, QUALITATIVE (ARMC ONLY)
Amphetamines, Ur Screen: NOT DETECTED
Barbiturates, Ur Screen: NOT DETECTED
Benzodiazepine, Ur Scrn: NOT DETECTED
Cannabinoid 50 Ng, Ur ~~LOC~~: NOT DETECTED
Cocaine Metabolite,Ur ~~LOC~~: NOT DETECTED
MDMA (Ecstasy)Ur Screen: NOT DETECTED
Methadone Scn, Ur: NOT DETECTED
Opiate, Ur Screen: NOT DETECTED
Phencyclidine (PCP) Ur S: NOT DETECTED
Tricyclic, Ur Screen: NOT DETECTED

## 2023-02-11 LAB — CBC WITH DIFFERENTIAL/PLATELET
Abs Immature Granulocytes: 0.02 10*3/uL (ref 0.00–0.07)
Basophils Absolute: 0.1 10*3/uL (ref 0.0–0.1)
Basophils Relative: 1 %
Eosinophils Absolute: 0.2 10*3/uL (ref 0.0–0.5)
Eosinophils Relative: 3 %
HCT: 42.1 % (ref 36.0–46.0)
Hemoglobin: 14.1 g/dL (ref 12.0–15.0)
Immature Granulocytes: 0 %
Lymphocytes Relative: 21 %
Lymphs Abs: 1.3 10*3/uL (ref 0.7–4.0)
MCH: 30.1 pg (ref 26.0–34.0)
MCHC: 33.5 g/dL (ref 30.0–36.0)
MCV: 90 fL (ref 80.0–100.0)
Monocytes Absolute: 0.5 10*3/uL (ref 0.1–1.0)
Monocytes Relative: 8 %
Neutro Abs: 4.3 10*3/uL (ref 1.7–7.7)
Neutrophils Relative %: 67 %
Platelets: 217 10*3/uL (ref 150–400)
RBC: 4.68 MIL/uL (ref 3.87–5.11)
RDW: 12.2 % (ref 11.5–15.5)
WBC: 6.4 10*3/uL (ref 4.0–10.5)
nRBC: 0 % (ref 0.0–0.2)

## 2023-02-11 LAB — COMPREHENSIVE METABOLIC PANEL
ALT: 10 U/L (ref 0–44)
AST: 19 U/L (ref 15–41)
Albumin: 4.2 g/dL (ref 3.5–5.0)
Alkaline Phosphatase: 75 U/L (ref 38–126)
Anion gap: 8 (ref 5–15)
BUN: 8 mg/dL (ref 8–23)
CO2: 24 mmol/L (ref 22–32)
Calcium: 9.1 mg/dL (ref 8.9–10.3)
Chloride: 105 mmol/L (ref 98–111)
Creatinine, Ser: 1.06 mg/dL — ABNORMAL HIGH (ref 0.44–1.00)
GFR, Estimated: 58 mL/min — ABNORMAL LOW (ref >60)
Glucose, Bld: 96 mg/dL (ref 70–99)
Potassium: 3.8 mmol/L (ref 3.5–5.1)
Sodium: 137 mmol/L (ref 135–145)
Total Bilirubin: 1.5 mg/dL — ABNORMAL HIGH (ref 0.3–1.2)
Total Protein: 7.2 g/dL (ref 6.5–8.1)

## 2023-02-11 LAB — URINALYSIS, ROUTINE W REFLEX MICROSCOPIC
Bilirubin Urine: NEGATIVE
Glucose, UA: NEGATIVE mg/dL
Hgb urine dipstick: NEGATIVE
Ketones, ur: NEGATIVE mg/dL
Leukocytes,Ua: NEGATIVE
Nitrite: NEGATIVE
Protein, ur: NEGATIVE mg/dL
Specific Gravity, Urine: 1.006 (ref 1.005–1.030)
pH: 5 (ref 5.0–8.0)

## 2023-02-11 LAB — CBG MONITORING, ED: Glucose-Capillary: 93 mg/dL (ref 70–99)

## 2023-02-11 LAB — CK: Total CK: 232 U/L (ref 38–234)

## 2023-02-11 MED ORDER — LORAZEPAM 2 MG/ML IJ SOLN: 0.5000 mg | Freq: Once | INTRAMUSCULAR | Status: DC

## 2023-02-11 MED ORDER — GADOBUTROL 1 MMOL/ML IV SOLN
10.0000 mL | Freq: Once | INTRAVENOUS | Status: AC | PRN
  Administered 2023-02-11: 10 mL via INTRAVENOUS

## 2023-02-11 MED ORDER — LORAZEPAM 2 MG/ML IJ SOLN
1.0000 mg | Freq: Once | INTRAMUSCULAR | Status: AC | PRN
  Administered 2023-02-11: 1 mg via INTRAVENOUS
  Filled 2023-02-11: qty 1

## 2023-02-11 NOTE — ED Triage Notes (Addendum)
Patient reports about a week ago she started having dry mouth, sore tongue, and uncontrollable body movements. The uncontrollable body movements have progressively gotten worse. Patient is having jerking/twitching movements while being triaged and cannot sit still. Husband reports intermittent, slight confusion.   Denies injury  Denies taking any psychotics

## 2023-02-11 NOTE — ED Provider Notes (Signed)
[facility] Provider Note    Event Date/Time   First MD Initiated Contact with Patient 02/11/23 1730     (approximate)   History   uncontrollable movements and Altered Mental Status   HPI  Felicia Dunn is a 67 y.o. female who comes in with concerns for uncontrolled body movements.  Patient reports about a week ago she started developing right-sided abnormal movements.  She states that she tries to concentrate really hard she can somewhat prevent them from happening but they still happen.  She reports her husband goes to sleep before her so she is not sure if they are happening at nighttime.  She reports it is mostly her right arm and right leg.  She denies ever having the like this previously denies any history of any recent stressors denies any chest pain, shortness of breath.  Husband reports some intermittent confusion but otherwise she has been in her normal mood.  She denies any history of stroke she is not taking any medications.  Denies any alcohol or drug use.     Physical Exam   Triage Vital Signs: ED Triage Vitals  Enc Vitals Group     BP 02/11/23 1346 (!) 149/106     Pulse Rate 02/11/23 1346 72     Resp 02/11/23 1346 16     Temp 02/11/23 1346 98.2 F (36.8 C)     Temp Source 02/11/23 1346 Oral     SpO2 02/11/23 1346 95 %     Weight 02/11/23 1347 220 lb (99.8 kg)     Height 02/11/23 1347 6\' 5"  (1.956 m)     Head Circumference --      Peak Flow --      Pain Score 02/11/23 1346 0     Pain Loc --      Pain Edu? --      Excl. in GC? --     Most recent vital signs: Vitals:   02/11/23 1346  BP: (!) 149/106  Pulse: 72  Resp: 16  Temp: 98.2 F (36.8 C)  SpO2: 95%     General: Awake, no distress.  CV:  Good peripheral perfusion.  Resp:  Normal effort.  Abd:  No distention.  Other:  Patient has some irregular movements on her right side but cranial nerves otherwise are intact with equal strength in arms and legs.  Finger-nose  intact bilaterally still able to follow commands on the right side.  Patient able to suppress these movements if she concentrates and they happen less frequently. Oropharynx is clear.  Uvula midline.  Full range of motion of neck.  ED Results / Procedures / Treatments   Labs (all labs ordered are listed, but only abnormal results are displayed) Labs Reviewed  COMPREHENSIVE METABOLIC PANEL - Abnormal; Notable for the following components:      Result Value   Creatinine, Ser 1.06 (*)    Total Bilirubin 1.5 (*)    GFR, Estimated 58 (*)    All other components within normal limits  CBC WITH DIFFERENTIAL/PLATELET  CBG MONITORING, ED     EKG  My interpretation of EKG:  Normal sinus rate 71 without any ST elevation or T wave inversions, normal intervals  RADIOLOGY I have reviewed the CT had personally and interpreted and the concern for a possible infarct in the left cerebellar   PROCEDURES:  Critical Care performed: No  Procedures   MEDICATIONS ORDERED IN ED: Medications - No data to display  IMPRESSION / MDM / ASSESSMENT AND PLAN / ED COURSE  I reviewed the triage vital signs and the nursing notes.   Patient's presentation is most consistent with acute presentation with potential threat to life or bodily function.   Differential includes intracranial mass, stroke, seizures, Electra abnormalities, substance abuse, UTI, medication reaction.  Patient does not look meningitic no fever- no tick bites prior to this happening.   CBC reassuring.  CMP slightly elevated creatinine, total bilirubin slightly elevated but ammonia test was negative no history of cirrhosis no right upper quadrant abdominal pain urine without evidence of UTI.  CK negative.   CT head with possible subacute infarct.  I discussed the case with Dr. Selina Cooley from neurology and sent her a video of the movements.  Dr. Selina Cooley reviewed the video and did not feel like this was a seizure activity especially given  patient reports it gets better with concentration.  She did recommend an MRI with and without contrast.  IMPRESSION: 1. No acute intracranial abnormality. 2. 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  Patient was given some Ativan to try to help with the MRI which somewhat decreased it but patient reports still having the movements.  We discussed the case with Dr. Selina Cooley from neurology.  Does not feel like this requires admission to the hospital given does not seem consistent with seizures/MRI is negative can follow-up outpatient with neurology for further worjup.  Discussed with patient she does report some stress with her family members she denies any SI or HI.  NO indication for emergent psych consult.  Did discuss this with patient and she feel comfortable with discharge home.  Understands not to drive to return to the ER if she develops a change in symptoms or worsening symptoms.  Husband also does report that it is not happening at nighttime that when she is sleeping it seems to stop and the fact that she can somewhat focus and make that happen less often or less severe is also reassuring.  She continues to report a dry mouth which have given her ENT follow-up I do not see any evidence of thrush or other acute pathology.  Did recommend that she follow-up with her PCP for further workup as this could consider further rheumatological testing   The patient is on the cardiac monitor to evaluate for evidence of arrhythmia and/or significant heart rate changes.      FINAL CLINICAL IMPRESSION(S) / ED DIAGNOSES   Final diagnoses:  Abnormal movement     Rx / DC Orders   ED Discharge Orders     None        Note:  This document was prepared using Dragon voice recognition software and may include unintentional dictation errors.   Concha Se, MD 02/11/23 2026

## 2023-02-11 NOTE — Discharge Instructions (Addendum)
There were no acute findings in your blood work or MRI that could explain these movements and I discussed the case with the neurologist who did not think that they were seizures but if you find that they are happening more frequently, occurring at nighttime when you are sleeping we are not able to focus and have them occur less often then please return to the ER for repeat evaluation or he develop any other concerns otherwise please call the neurology numbers to make a follow-up appointment.  DO not drive-. Use caution when using heavy equipment or power tools. Avoid working on ladders or at heights. Take showers instead of baths. Ensure the water temperature is not too high on the home water heater. Do not go swimming alone.   Also, Maintain good sleep hygiene. Avoid alcohol.   IMPRESSION: 1. No acute intracranial abnormality. 2. 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

## 2023-02-11 NOTE — ED Notes (Signed)
Patient transported to MRI 

## 2023-02-12 ENCOUNTER — Observation Stay: Payer: Medicare HMO

## 2023-02-12 ENCOUNTER — Inpatient Hospital Stay
Admission: EM | Admit: 2023-02-12 | Discharge: 2023-02-20 | DRG: 057 | Disposition: A | Discharge status: Home / Self Care | Payer: Medicare HMO | Attending: Internal Medicine | Admitting: Internal Medicine

## 2023-02-12 ENCOUNTER — Other Ambulatory Visit: Payer: Self-pay

## 2023-02-12 DIAGNOSIS — G255 Other chorea: Principal | ICD-10-CM | POA: Diagnosis present

## 2023-02-12 DIAGNOSIS — Z791 Long term (current) use of non-steroidal anti-inflammatories (NSAID): Secondary | ICD-10-CM

## 2023-02-12 DIAGNOSIS — N182 Chronic kidney disease, stage 2 (mild): Secondary | ICD-10-CM | POA: Insufficient documentation

## 2023-02-12 DIAGNOSIS — G4734 Idiopathic sleep related nonobstructive alveolar hypoventilation: Secondary | ICD-10-CM | POA: Insufficient documentation

## 2023-02-12 DIAGNOSIS — E663 Overweight: Secondary | ICD-10-CM | POA: Insufficient documentation

## 2023-02-12 DIAGNOSIS — E876 Hypokalemia: Secondary | ICD-10-CM | POA: Diagnosis not present

## 2023-02-12 DIAGNOSIS — N393 Stress incontinence (female) (male): Secondary | ICD-10-CM | POA: Diagnosis present

## 2023-02-12 DIAGNOSIS — R911 Solitary pulmonary nodule: Secondary | ICD-10-CM | POA: Diagnosis present

## 2023-02-12 DIAGNOSIS — I951 Orthostatic hypotension: Secondary | ICD-10-CM | POA: Diagnosis not present

## 2023-02-12 DIAGNOSIS — Z6826 Body mass index (BMI) 26.0-26.9, adult: Secondary | ICD-10-CM

## 2023-02-12 DIAGNOSIS — G4730 Sleep apnea, unspecified: Secondary | ICD-10-CM | POA: Diagnosis present

## 2023-02-12 DIAGNOSIS — E871 Hypo-osmolality and hyponatremia: Secondary | ICD-10-CM | POA: Insufficient documentation

## 2023-02-12 DIAGNOSIS — Z1152 Encounter for screening for COVID-19: Secondary | ICD-10-CM

## 2023-02-12 DIAGNOSIS — E538 Deficiency of other specified B group vitamins: Secondary | ICD-10-CM | POA: Insufficient documentation

## 2023-02-12 DIAGNOSIS — Z79890 Hormone replacement therapy (postmenopausal): Secondary | ICD-10-CM

## 2023-02-12 DIAGNOSIS — J479 Bronchiectasis, uncomplicated: Secondary | ICD-10-CM | POA: Diagnosis present

## 2023-02-12 DIAGNOSIS — E785 Hyperlipidemia, unspecified: Secondary | ICD-10-CM | POA: Diagnosis present

## 2023-02-12 DIAGNOSIS — R259 Unspecified abnormal involuntary movements: Principal | ICD-10-CM

## 2023-02-12 DIAGNOSIS — F17290 Nicotine dependence, other tobacco product, uncomplicated: Secondary | ICD-10-CM | POA: Diagnosis present

## 2023-02-12 DIAGNOSIS — G319 Degenerative disease of nervous system, unspecified: Secondary | ICD-10-CM | POA: Diagnosis present

## 2023-02-12 DIAGNOSIS — R0902 Hypoxemia: Secondary | ICD-10-CM | POA: Diagnosis not present

## 2023-02-12 DIAGNOSIS — I5189 Other ill-defined heart diseases: Secondary | ICD-10-CM | POA: Diagnosis present

## 2023-02-12 DIAGNOSIS — E559 Vitamin D deficiency, unspecified: Secondary | ICD-10-CM | POA: Insufficient documentation

## 2023-02-12 DIAGNOSIS — M1990 Unspecified osteoarthritis, unspecified site: Secondary | ICD-10-CM | POA: Diagnosis present

## 2023-02-12 LAB — BASIC METABOLIC PANEL
Anion gap: 6 (ref 5–15)
BUN: 9 mg/dL (ref 8–23)
CO2: 26 mmol/L (ref 22–32)
Calcium: 9 mg/dL (ref 8.9–10.3)
Chloride: 105 mmol/L (ref 98–111)
Creatinine, Ser: 1.18 mg/dL — ABNORMAL HIGH (ref 0.44–1.00)
GFR, Estimated: 51 mL/min — ABNORMAL LOW (ref >60)
Glucose, Bld: 96 mg/dL (ref 70–99)
Potassium: 3.7 mmol/L (ref 3.5–5.1)
Sodium: 137 mmol/L (ref 135–145)

## 2023-02-12 LAB — HEPATIC FUNCTION PANEL
ALT: 10 U/L (ref 0–44)
AST: 21 U/L (ref 15–41)
Albumin: 3.8 g/dL (ref 3.5–5.0)
Alkaline Phosphatase: 67 U/L (ref 38–126)
Bilirubin, Direct: 0.2 mg/dL (ref 0.0–0.2)
Indirect Bilirubin: 0.8 mg/dL (ref 0.3–0.9)
Total Bilirubin: 1 mg/dL (ref 0.3–1.2)
Total Protein: 6.4 g/dL — ABNORMAL LOW (ref 6.5–8.1)

## 2023-02-12 LAB — CBC
HCT: 40.8 % (ref 36.0–46.0)
Hemoglobin: 13.5 g/dL (ref 12.0–15.0)
MCH: 29.7 pg (ref 26.0–34.0)
MCHC: 33.1 g/dL (ref 30.0–36.0)
MCV: 89.9 fL (ref 80.0–100.0)
Platelets: 228 10*3/uL (ref 150–400)
RBC: 4.54 MIL/uL (ref 3.87–5.11)
RDW: 12.2 % (ref 11.5–15.5)
WBC: 7 10*3/uL (ref 4.0–10.5)
nRBC: 0 % (ref 0.0–0.2)

## 2023-02-12 LAB — TSH: TSH: 1.808 u[IU]/mL (ref 0.350–4.500)

## 2023-02-12 LAB — MAGNESIUM: Magnesium: 1.6 mg/dL — ABNORMAL LOW (ref 1.7–2.4)

## 2023-02-12 MED ORDER — GADOBUTROL 1 MMOL/ML IV SOLN
10.0000 mL | Freq: Once | INTRAVENOUS | Status: AC | PRN
  Administered 2023-02-12: 10 mL via INTRAVENOUS

## 2023-02-12 MED ORDER — ONDANSETRON HCL 4 MG/2ML IJ SOLN: 4.0000 mg | Freq: Four times a day (QID) | INTRAMUSCULAR | Status: DC | PRN

## 2023-02-12 MED ORDER — POLYETHYLENE GLYCOL 3350 17 G PO PACK: 17.0000 g | PACK | Freq: Every day | ORAL | Status: DC | PRN

## 2023-02-12 MED ORDER — MAGNESIUM SULFATE 2 GM/50ML IV SOLN
2.0000 g | Freq: Once | INTRAVENOUS | Status: AC
  Administered 2023-02-12: 2 g via INTRAVENOUS
  Filled 2023-02-12: qty 50

## 2023-02-12 MED ORDER — ACETAMINOPHEN 325 MG PO TABS
650.0000 mg | ORAL_TABLET | Freq: Four times a day (QID) | ORAL | Status: DC | PRN
  Administered 2023-02-17 – 2023-02-20 (×3): 650 mg via ORAL
  Filled 2023-02-12 (×3): qty 2

## 2023-02-12 MED ORDER — ENOXAPARIN SODIUM 40 MG/0.4ML IJ SOSY
40.0000 mg | PREFILLED_SYRINGE | INTRAMUSCULAR | Status: DC
  Administered 2023-02-12: 40 mg via SUBCUTANEOUS
  Filled 2023-02-12: qty 0.4

## 2023-02-12 MED ORDER — LORAZEPAM 2 MG/ML IJ SOLN
2.0000 mg | Freq: Once | INTRAMUSCULAR | Status: AC
  Administered 2023-02-12: 2 mg via INTRAVENOUS
  Filled 2023-02-12: qty 1

## 2023-02-12 MED ORDER — ONDANSETRON HCL 4 MG PO TABS: 4.0000 mg | ORAL_TABLET | Freq: Four times a day (QID) | ORAL | Status: DC | PRN

## 2023-02-12 MED ORDER — ACETAMINOPHEN 650 MG RE SUPP: 650.0000 mg | Freq: Four times a day (QID) | RECTAL | Status: DC | PRN

## 2023-02-12 NOTE — H&P (Addendum)
History and Physical    Patient: Felicia Dunn WUJ:[id number] DOB: [birth year] DOA: 02/12/2023 DOS: the patient was seen and examined on 02/12/2023 PCP: Mick Sell, MD  Patient coming from: Home  Chief Complaint:  Chief Complaint  Patient presents with   Abnormal Movement   HPI: Felicia Dunn is a 67 y.o. female with medical history significant of stress incontinence, arthritis, vertigo, who presents to the ED due to abnormal movement.  Felicia Dunn states that approximately 1 week ago, she was experiencing extremely dry mouth.  Shortly thereafter, she began to experience involuntary movements of her right arm and her right leg.  In addition, she is experiencing uncontrollable movements of her tongue and this has led to a sore on the side of her tongue as it rubs her teeth.  She denies biting her tongue.  She denies any recent illness including fever, chills but has been experiencing a dry cough and rhinorrhea that she believed was secondary to pollen.  She denies any chest pain, shortness of breath, palpitations, nausea, vomiting, diarrhea, abdominal pain.  She denies any new medications, but states she began intravaginal estradiol approximately 1 month ago.  Patient's husband at bedside states she has seemed more forgetful this week and more irritable, with difficulty sleeping.  No other memory or behavioral abnormalities noted.  Per chart review, patient presented yesterday with similar Symptoms.  Workup at that time demonstrated increased creatinine, but normal CBC.  CT head with possible subacute infarct.  MRI was obtained that demonstrated generalized cerebral atrophy and small vessel disease with remote lacunar infarcts but no acute or subacute infarct.  Video of abnormal movements was sent to Dr. Selina Cooley who recommended outpatient follow-up if MRI is negative is inconsistent with seizure.  She was instructed to return to the ED if symptoms continued.    ED course: On arrival to  the ED, patient was normotensive at 128/83 with heart rate of 91.  She was saturating at 98% on room air.  She was afebrile at 98.4. Initial workup demonstrated normal CBC, potassium 3.7, creatinine 1.18, calcium 9.0 and GFR 51.  Neurology was consulted with recommendation to repeat MRI, in addition to an Ativan challenge.  [facility] contacted for admission.  Review of Systems: As mentioned in the history of present illness. All other systems reviewed and are negative.  Past Medical History:  Diagnosis Date   Arthritis    right knee   Vertigo    ?orthostatic   Past Surgical History:  Procedure Laterality Date   APPENDECTOMY     CATARACT EXTRACTION W/PHACO Left 09/13/2016   Procedure: CATARACT EXTRACTION PHACO AND INTRAOCULAR LENS PLACEMENT (IOC);  Surgeon: Nevada Crane, MD;  Location: [facility];  Service: Ophthalmology;  Laterality: Left;  SYMFONY TORIC LENS LEFT   DILATION AND CURETTAGE OF UTERUS     Social History:  reports that she quit smoking about 10 years ago. Her smoking use included cigarettes. She has a 64.50 pack-year smoking history. She has never used smokeless tobacco. She reports that she does not currently use alcohol. She reports that she does not use drugs.  No Known Allergies  No family history on file.  Prior to Admission medications   Medication Sig Start Date End Date Taking? Authorizing Provider  aspirin 325 MG tablet Take 325 mg by mouth as needed.    [provider]    Physical Exam: Vitals:   02/12/23 1450 02/12/23 1453  BP:  128/83  Pulse: 91  Resp: 18   Temp: 98.4 F (36.9 C)   TempSrc: Oral   SpO2: 97%   Weight:  99.8 kg  Height:  6\' 5"  (1.956 m)   Physical Exam Vitals and nursing note reviewed.  Constitutional:      General: She is not in acute distress.    Appearance: She is normal weight. She is not toxic-appearing.  HENT:     Head: Normocephalic and atraumatic.     Mouth/Throat:     Mouth: Mucous membranes are dry.      Comments: Small area of irritation on the right lateral aspect of the tongue.  No bleeding or laceration. Eyes:     Extraocular Movements: Extraocular movements intact.     Conjunctiva/sclera: Conjunctivae normal.     Pupils: Pupils are equal, round, and reactive to light.  Cardiovascular:     Rate and Rhythm: Normal rate and regular rhythm.     Heart sounds: No murmur heard.    No gallop.  Pulmonary:     Effort: Pulmonary effort is normal. No respiratory distress.     Breath sounds: Normal breath sounds. No wheezing, rhonchi or rales.  Abdominal:     General: Bowel sounds are normal. There is no distension.     Palpations: Abdomen is soft.     Tenderness: There is no abdominal tenderness. There is no guarding.  Musculoskeletal:     Right lower leg: No edema.     Left lower leg: No edema.  Skin:    General: Skin is warm and dry.  Neurological:     Mental Status: She is alert.     Comments:  Patient is alert and oriented x 4 Right arm and right leg movements that seems spastic in nature and occur every few seconds, with the right arm more frequently than the right leg.  Somewhat improved when intentional movements are occurring such as reaching for her cup or drinking water.   No focal weakness noted.  Psychiatric:        Mood and Affect: Mood normal.        Behavior: Behavior normal.    Data Reviewed: CBC with WBC of 7.0, hemoglobin 13.5, MCV 89, platelets of 228 BMP with sodium of 137, potassium 3.7 bicarb 26 glucose 96 BUN 9 creatinine 1.18 GFR 51  Urinalysis obtained yesterday with no abnormalities.  UDS obtained yesterday negative  EKG personally reviewed reviewed.  Sinus rhythm with rate of 73.  No acute ST or T wave changes consistent with acute ischemia.  MR Brain W and Wo Contrast  Result Date: 02/11/2023 CLINICAL DATA:  Initial evaluation for headache, neuro deficit. EXAM: MRI HEAD WITHOUT AND WITH CONTRAST TECHNIQUE: Multiplanar, multiecho pulse sequences of  the brain and surrounding structures were obtained without and with intravenous contrast. CONTRAST:  10mL GADAVIST GADOBUTROL 1 MMOL/ML IV SOLN COMPARISON:  CT from earlier the same day. FINDINGS: Brain: Diffuse prominence of the CSF containing spaces compatible generalized cerebral atrophy. Patchy T2/FLAIR hyperintensity involving the periventricular and deep white matter both cerebral hemispheres, consistent with chronic small vessel ischemic disease, mild in nature. Few scatter remote lacunar infarcts present about the thalami. Small remote left cerebellar infarct noted. No evidence for acute or subacute ischemia. Gray-white matter differentiation maintained. No areas of chronic cortical infarction. No acute or chronic intracranial blood products. No mass lesion, midline shift or mass effect. Mild ventricular prominence related to global parenchymal volume loss without hydrocephalus. No extra-axial fluid collection. Pituitary gland and suprasellar region within  normal limits. Vascular: Major intracranial vascular flow voids are maintained. Skull and upper cervical spine: Craniocervical junction within normal limits. Bone marrow signal intensity normal. No scalp soft tissue abnormality. Sinuses/Orbits: Prior ocular lens replacement on the left. Paranasal sinuses are largely clear. No significant mastoid effusion. Other: None. IMPRESSION: 1. No acute intracranial abnormality. 2. 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 Electronically Signed   By: Rise Mu M.D.   On: 02/11/2023 19:15    Results are pending, will review when available.  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  During this week, patient has been more forgetful (i.e. unable to remember that her son visited 1 day  prior) and more irritable.  Dry cough and rhinorrhea, but no other symptoms to suggest active infection.  No marked electrolyte abnormalities.  No family history of similar presentations.  No known underlying malignancy, although patient is still smoking; CT lung cancer screening in 2022 with LUNG-RADS 2.  Only current medications are meloxicam and intravaginal estradiol.  Estrogen is a possible etiology although uncertain on systemic absorption. Neurology consulted and are recommending Ativan trial and repeating MRI.  - Neurology following; appreciate their recommendations - Ativan 2 mg IV once ordered - Monitor pulse oximetry after Ativan administration - MRI with and without contrast pending - Magnesium, TSH, vitamin B12, vitamin D pending - CT chest without contrast to reassess lung nodules - Hold home estradiol  Advance Care Planning:   Code Status: Full Code verified by patient with husband at bedside  Consults: Neurology  Family Communication: Patient's husband updated at bedside  Severity of Illness: The appropriate patient status for this patient is OBSERVATION. Observation status is judged to be reasonable and necessary in order to provide the required intensity of service to ensure the patient's safety. The patient's presenting symptoms, physical exam findings, and initial radiographic and laboratory data in the context of their medical condition is felt to place them at decreased risk for further clinical deterioration. Furthermore, it is anticipated that the patient will be medically stable for discharge from the hospital within 2 midnights of admission.   Author: Verdene Lennert, MD 02/12/2023 6:01 PM  For on call review [email or web address].

## 2023-02-12 NOTE — Assessment & Plan Note (Addendum)
Patient reports 1 week of involuntary right arm and leg movements that have been persistent since onset.  During this week, patient has been more forgetful (i.e. unable to remember that her son visited 1 day prior) and more irritable.  Dry cough and rhinorrhea, but no other symptoms to suggest active infection.  No marked electrolyte abnormalities.  No family history of similar presentations.  No known underlying malignancy, although patient is still smoking; CT lung cancer screening in 2022 with LUNG-RADS 2.  Only current medications are meloxicam and intravaginal estradiol.  Estrogen is a possible etiology although uncertain on systemic absorption. Neurology consulted and are recommending Ativan trial and repeating MRI.  - Neurology following; appreciate their recommendations - Ativan 2 mg IV once ordered - Monitor pulse oximetry after Ativan administration - MRI with and without contrast pending - Magnesium, TSH, vitamin B12, vitamin D pending - CT chest without contrast to reassess lung nodules - Hold home estradiol

## 2023-02-12 NOTE — ED Provider Notes (Signed)
[facility] Provider Note    Event Date/Time   First MD Initiated Contact with Patient 02/12/23 1606     (approximate)   History   Abnormal Movement   HPI  Felicia Dunn is a 67 y.o. female who I saw yesterday for uncontrolled body movements who returns for worsening uncontrolled body movements.  I discussed with patient that if her symptoms are worsening she should return to the ER so she can see neurology.  Patient reports that the symptoms have been worsening and that her movements are more uncontrolled and she has developed some increasing weakness on the right side.  She denies any other new symptoms.  Physical Exam   Triage Vital Signs: ED Triage Vitals  Enc Vitals Group     BP 02/12/23 1453 128/83     Pulse Rate 02/12/23 1450 91     Resp 02/12/23 1450 18     Temp 02/12/23 1450 98.4 F (36.9 C)     Temp Source 02/12/23 1450 Oral     SpO2 02/12/23 1450 97 %     Weight 02/12/23 1453 220 lb 0.3 oz (99.8 kg)     Height 02/12/23 1453 6\' 5"  (1.956 m)     Head Circumference --      Peak Flow --      Pain Score 02/12/23 1450 0     Pain Loc --      Pain Edu? --      Excl. in GC? --     Most recent vital signs: Vitals:   02/12/23 1450 02/12/23 1453  BP:  128/83  Pulse: 91   Resp: 18   Temp: 98.4 F (36.9 C)   SpO2: 97%      General: Awake, no distress.  CV:  Good peripheral perfusion.  Resp:  Normal effort.  Abd:  No distention.  Other:  Worsening of rhythmic movement noted on the right side   ED Results / Procedures / Treatments   Labs (all labs ordered are listed, but only abnormal results are displayed) Labs Reviewed  BASIC METABOLIC PANEL - Abnormal; Notable for the following components:      Result Value   Creatinine, Ser 1.18 (*)    GFR, Estimated 51 (*)    All other components within normal limits  CBC  URINALYSIS, ROUTINE W REFLEX MICROSCOPIC  CBG MONITORING, ED     EKG  My interpretation of EKG:  Normal  sinus rate of 73 without any ST elevation or T wave inversions, normal intervals  RADIOLOGY I have reviewed the CT had personally interpreted from yesterday without any evidence of intracranial hemorrhage PROCEDURES:  Critical Care performed: No  Procedures   MEDICATIONS ORDERED IN ED: Medications  LORazepam (ATIVAN) injection 2 mg (has no administration in time range)     IMPRESSION / MDM / ASSESSMENT AND PLAN / ED COURSE  I reviewed the triage vital signs and the nursing notes.   Patient's presentation is most consistent with acute presentation with potential threat to life or bodily function.   Patient comes in with continued worsening movement on the right side concerning for primary neurological process.  I did discuss again with Dr. Selina Cooley and she was able to see patient who felt like this was now more pronounced than it was yesterday and concern for Hemi chorea-recommended admission to the hospital for repeat MRI, 2 mg of IV Ativan and further workup of this.    CBC is stable from yesterday BMP  slightly increasing creatinine.  I reviewed patient's office visit with internal medicine on 12/12/2022 with Dr. Sampson Goon     FINAL CLINICAL IMPRESSION(S) / ED DIAGNOSES   Final diagnoses:  Abnormal movement     Rx / DC Orders   ED Discharge Orders     None        Note:  This document was prepared using Dragon voice recognition software and may include unintentional dictation errors.   Concha Se, MD 02/12/23 [phone]

## 2023-02-12 NOTE — ED Notes (Addendum)
Pt was seen yesterday for same, had stroke workup, seen by neuro, and had a MRI. Repeat CT cancelled per Dr. Lenard Lance.

## 2023-02-12 NOTE — Consult Note (Signed)
NEUROLOGY CONSULTATION NOTE   Date of service: February 12, 2023 Patient Name: Felicia Dunn MRN:  [id number] DOB:  Feb 25, 1956 Reason for consult: involuntary movements Requesting physician: Artis Delay _ _ _   _ __   _ __ _ _  __ __   _ __   __ _  History of Present Illness   67 yo woman with hx vertigo presents to ED for 2nd time in 2 days for worsening involuntary movement. She began to feel that her mouth was extremely dry about one week ago and shortly after that began experiencing involuntary movements of her RUE and RLE. She has a sore tongue from involuntary movements of her tongue in her mouth. She has had so recent s/s infection other than rhinorrhea she attributes to allergies. She started HRT with transvaginal estrogen one month ago and denies other recent medications. She does not have any new cognitive or psychiatric symptoms. There is no family history of movement disorder or early onset / rapidly progressivev dementia. The movements are hyperkinetic, limited to her right face/arm/body. She initially presented to ED yesterday with milder symptoms. Review of video of movements reviewed from that initial ED visit shows mild athetoid movements of R side only. At that time she was able to volitionally mitigate the movements but only partially and for a brief amount of time. MRI brain wwo at that time was unremarkable and she was discharged to f/u with neurology outpatient. She was told to return to ED if sx worsened and she presented today with much more pronounced hemichorea again limited to the R side of her body.   ROS   Per HPI: all other systems reviewed and are negative  Past History   I have reviewed the following:  Past Medical History:  Diagnosis Date   Arthritis    right knee   Vertigo    ?orthostatic   Past Surgical History:  Procedure Laterality Date   APPENDECTOMY     CATARACT EXTRACTION W/PHACO Left 09/13/2016   Procedure: CATARACT EXTRACTION PHACO AND  INTRAOCULAR LENS PLACEMENT (IOC);  Surgeon: Nevada Crane, MD;  Location: [facility];  Service: Ophthalmology;  Laterality: Left;  SYMFONY TORIC LENS LEFT   DILATION AND CURETTAGE OF UTERUS     No family history on file. Social History   Socioeconomic History   Marital status: Married    Spouse name: Not on file   Number of children: Not on file   Years of education: Not on file   Highest education level: Not on file  Occupational History   Not on file  Tobacco Use   Smoking status: Former    Packs/day: 1.50    Years: 43.00    Additional pack years: 0.00    Total pack years: 64.50    Types: Cigarettes    Quit date: 12/02/2012    Years since quitting: 10.2   Smokeless tobacco: Never  Vaping Use   Vaping Use: Every day   Substances: Nicotine  Substance and Sexual Activity   Alcohol use: Not Currently    Comment: 2 glass wine/month   Drug use: No   Sexual activity: Not Currently  Other Topics Concern   Not on file  Social History Narrative   Not on file   Social Determinants of Health   Financial Resource Strain: Not on file  Food Insecurity: Not on file  Transportation Needs: Not on file  Physical Activity: Not on file  Stress: Not on file  Social  Connections: Not on file   No Known Allergies  Medications   (Not in a hospital admission)     Current Facility-Administered Medications:    acetaminophen (TYLENOL) tablet 650 mg, 650 mg, Oral, Q6H PRN **OR** acetaminophen (TYLENOL) suppository 650 mg, 650 mg, Rectal, Q6H PRN, Verdene Lennert, MD   enoxaparin (LOVENOX) injection 40 mg, 40 mg, Subcutaneous, Q24H, Huel Cote, Iulia, MD   magnesium sulfate IVPB 2 g 50 mL, 2 g, Intravenous, Once, Verdene Lennert, MD   ondansetron (ZOFRAN) tablet 4 mg, 4 mg, Oral, Q6H PRN **OR** ondansetron (ZOFRAN) injection 4 mg, 4 mg, Intravenous, Q6H PRN, Verdene Lennert, MD   polyethylene glycol (MIRALAX / GLYCOLAX) packet 17 g, 17 g, Oral, Daily PRN, Verdene Lennert,  MD  Current Outpatient Medications:    Estradiol 10 MCG TABS vaginal tablet, Place 1 tablet vaginally 2 (two) times a week., Disp: , Rfl:    meloxicam (MOBIC) 15 MG tablet, Take 15 mg by mouth daily., Disp: , Rfl:   Vitals   Vitals:   02/12/23 1450 02/12/23 1453  BP:  128/83  Pulse: 91   Resp: 18   Temp: 98.4 F (36.9 C)   TempSrc: Oral   SpO2: 97%   Weight:  99.8 kg  Height:  6\' 5"  (1.956 m)     Body mass index is 26.09 kg/m.  Physical Exam   Physical Exam Gen: A&O x4 HEENT: Atraumatic, normocephalic;mucous membranes moist; oropharynx clear, tongue without atrophy or fasciculations. Neck: Supple, trachea midline. Resp: CTAB, no w/r/r CV: RRR, no m/g/r; nml S1 and S2. 2+ symmetric peripheral pulses. Abd: soft/NT/ND; nabs x 4 quad Extrem: Nml bulk; no cyanosis, clubbing, or edema.  Neuro: *MS: A&O x4. Follows multi-step commands.  *Speech: fluid, nondysarthric, able to name and repeat *CN:    I: Deferred   II,III: PERRLA, VFF by confrontation, optic discs unable to be visualized 2/2 pupillary constriction   III,IV,VI: EOMI w/o nystagmus, no ptosis   V: Sensation intact from V1 to V3 to LT   VII: Eyelid closure was full.  Smile symmetric.   VIII: Hearing intact to voice   IX,X: Voice normal, palate elevates symmetrically    XI: SCM/trap 5/5 bilat   XII: Tongue protrudes midline, no atrophy or fasciculations  *Motor:   Normal bulk.  No tremor, rigidity or bradykinesia. Minimal weakness noted on R side throughout 4+/5. R-sided continuous arrhythmic moderate amplitude hemichorea on R side throughout the exam which improves when holding hands outstretched or with intentional movements such as FNF or reaching for an object. *Sensory: Intact to light touch, pinprick, temperature vibration throughout. Symmetric. Propioception intact bilat.  No double-simultaneous extinction.  *Coordination:  Finger-to-nose, heel-to-shin, rapid alternating motions were intact. *Reflexes:  2+  and symmetric throughout without clonus; toes down-going bilat *Gait: deferred    Labs   CBC:  Recent Labs  Lab 02/11/23 1352 02/12/23 1503  WBC 6.4 7.0  NEUTROABS 4.3  --   HGB 14.1 13.5  HCT 42.1 40.8  MCV 90.0 89.9  PLT 217 228    Basic Metabolic Panel:  Lab Results  Component Value Date   NA 137 02/12/2023   K 3.7 02/12/2023   CO2 26 02/12/2023   GLUCOSE 96 02/12/2023   BUN 9 02/12/2023   CREATININE 1.18 (H) 02/12/2023   CALCIUM 9.0 02/12/2023   GFRNONAA 51 (L) 02/12/2023   Lipid Panel: No results found for: "LDLCALC" HgbA1c: No results found for: "HGBA1C" Urine Drug Screen:     Component Value Date/Time  LABOPIA NONE DETECTED 02/11/2023 1940   COCAINSCRNUR NONE DETECTED 02/11/2023 1940   LABBENZ NONE DETECTED 02/11/2023 1940   AMPHETMU NONE DETECTED 02/11/2023 1940   THCU NONE DETECTED 02/11/2023 1940   LABBARB NONE DETECTED 02/11/2023 1940    Alcohol Level No results found for: "ETH"  MRI Brain wwo 4/28 1. Stable brain MRI.  No acute intracranial abnormality. 2. Atrophy with mild chronic small vessel ischemic disease, with a few scattered remote lacunar infarcts about the thalami and left cerebellum.  Imaging personally reviewed; I agree with above interpretation  Impression   67 yo woman with hx vertigo presents to ED with onset of R-sided hemichorea that began one week ago and has been worsening since that time. I suspect culprit may be the transvaginal estrogen she began one month ago; there are cases in the literature of postmenopausal women developing chorea after starting HRT. Ddx includes autoimmune or inflammatory process (SLE, antiphospholipid syndrome, Sjogrens, Wilson's disease, acute HIV infection, B12 deficiency, paraneoplastic syndrome. Patient vapes and had lung nodules on CT chest 2 years ago and we will reorder CT scan. If above workup is unrevealing and sx do not improve with stopping estrogen consider LP for further evaluation of  potential autoimmune/paraneoplastic etiology. She is not hyperglycemic and she has no family history of movement disorders.  Recommendations   - Discontinue transvaginal estrogen - ANA comprehensive panel with reflex to include anti-Ro and anti-La - Antiphospholipid panel - Ceruloplasmin, serum copper, HIV, B12 - Chest CT - Consider LP for further evaluation for potential autoimmune/paraneoplastic etiology if above workup unrevealing - Neurology will continue to follow   ______________________________________________________________________   Thank you for the opportunity to take part in the care of this patient. If you have any further questions, please contact the neurology consultation attending.  Signed,  Bing Neighbors, MD [facility] [phone]  If 7pm- 7am, please page neurology on call as listed in AMION.  **Any copied and pasted documentation in this note was written by me in another application not billed for and pasted by me into this document.

## 2023-02-12 NOTE — ED Triage Notes (Signed)
Pt here with abnormal movement. Pt was here yesterday for same and was discharged. Pt states her movements are worse today and is unable to get in to see a neurologist. Pt states her ring finger on her right hand is also hurting.

## 2023-02-12 NOTE — ED Notes (Signed)
Neuro to see pt in triage.

## 2023-02-12 NOTE — ED Notes (Signed)
Husband going home. Pt resting in bed, pt has taken BP cuff off and does not want it placed back on at this time.

## 2023-02-13 ENCOUNTER — Encounter: Payer: Self-pay | Admitting: Internal Medicine

## 2023-02-13 DIAGNOSIS — R259 Unspecified abnormal involuntary movements: Secondary | ICD-10-CM

## 2023-02-13 DIAGNOSIS — N182 Chronic kidney disease, stage 2 (mild): Secondary | ICD-10-CM | POA: Diagnosis present

## 2023-02-13 DIAGNOSIS — G4734 Idiopathic sleep related nonobstructive alveolar hypoventilation: Secondary | ICD-10-CM | POA: Diagnosis not present

## 2023-02-13 DIAGNOSIS — Z79890 Hormone replacement therapy (postmenopausal): Secondary | ICD-10-CM | POA: Diagnosis not present

## 2023-02-13 DIAGNOSIS — E871 Hypo-osmolality and hyponatremia: Secondary | ICD-10-CM | POA: Diagnosis not present

## 2023-02-13 DIAGNOSIS — Z1152 Encounter for screening for COVID-19: Secondary | ICD-10-CM | POA: Diagnosis not present

## 2023-02-13 DIAGNOSIS — E663 Overweight: Secondary | ICD-10-CM | POA: Diagnosis present

## 2023-02-13 DIAGNOSIS — R0902 Hypoxemia: Secondary | ICD-10-CM | POA: Diagnosis not present

## 2023-02-13 DIAGNOSIS — I951 Orthostatic hypotension: Secondary | ICD-10-CM | POA: Diagnosis not present

## 2023-02-13 DIAGNOSIS — G4730 Sleep apnea, unspecified: Secondary | ICD-10-CM | POA: Diagnosis present

## 2023-02-13 DIAGNOSIS — F17290 Nicotine dependence, other tobacco product, uncomplicated: Secondary | ICD-10-CM | POA: Diagnosis present

## 2023-02-13 DIAGNOSIS — G319 Degenerative disease of nervous system, unspecified: Secondary | ICD-10-CM | POA: Diagnosis present

## 2023-02-13 DIAGNOSIS — N393 Stress incontinence (female) (male): Secondary | ICD-10-CM | POA: Diagnosis present

## 2023-02-13 DIAGNOSIS — Z791 Long term (current) use of non-steroidal anti-inflammatories (NSAID): Secondary | ICD-10-CM | POA: Diagnosis not present

## 2023-02-13 DIAGNOSIS — E538 Deficiency of other specified B group vitamins: Secondary | ICD-10-CM | POA: Diagnosis present

## 2023-02-13 DIAGNOSIS — I5189 Other ill-defined heart diseases: Secondary | ICD-10-CM | POA: Diagnosis present

## 2023-02-13 DIAGNOSIS — G255 Other chorea: Secondary | ICD-10-CM

## 2023-02-13 DIAGNOSIS — E876 Hypokalemia: Secondary | ICD-10-CM | POA: Diagnosis not present

## 2023-02-13 DIAGNOSIS — R911 Solitary pulmonary nodule: Secondary | ICD-10-CM | POA: Diagnosis present

## 2023-02-13 DIAGNOSIS — E785 Hyperlipidemia, unspecified: Secondary | ICD-10-CM | POA: Diagnosis present

## 2023-02-13 DIAGNOSIS — I420 Dilated cardiomyopathy: Secondary | ICD-10-CM | POA: Diagnosis not present

## 2023-02-13 DIAGNOSIS — M1990 Unspecified osteoarthritis, unspecified site: Secondary | ICD-10-CM | POA: Diagnosis present

## 2023-02-13 DIAGNOSIS — J479 Bronchiectasis, uncomplicated: Secondary | ICD-10-CM | POA: Diagnosis present

## 2023-02-13 DIAGNOSIS — Z6826 Body mass index (BMI) 26.0-26.9, adult: Secondary | ICD-10-CM | POA: Diagnosis not present

## 2023-02-13 LAB — COMPREHENSIVE METABOLIC PANEL
ALT: 11 U/L (ref 0–44)
AST: 20 U/L (ref 15–41)
Albumin: 3.8 g/dL (ref 3.5–5.0)
Alkaline Phosphatase: 65 U/L (ref 38–126)
Anion gap: 7 (ref 5–15)
BUN: 9 mg/dL (ref 8–23)
CO2: 23 mmol/L (ref 22–32)
Calcium: 8.9 mg/dL (ref 8.9–10.3)
Chloride: 108 mmol/L (ref 98–111)
Creatinine, Ser: 1.06 mg/dL — ABNORMAL HIGH (ref 0.44–1.00)
GFR, Estimated: 58 mL/min — ABNORMAL LOW (ref >60)
Glucose, Bld: 104 mg/dL — ABNORMAL HIGH (ref 70–99)
Potassium: 3.4 mmol/L — ABNORMAL LOW (ref 3.5–5.1)
Sodium: 138 mmol/L (ref 135–145)
Total Bilirubin: 1.1 mg/dL (ref 0.3–1.2)
Total Protein: 6.5 g/dL (ref 6.5–8.1)

## 2023-02-13 LAB — TSH: TSH: 1.69 u[IU]/mL (ref 0.350–4.500)

## 2023-02-13 LAB — MAGNESIUM: Magnesium: 2.4 mg/dL (ref 1.7–2.4)

## 2023-02-13 LAB — RAPID HIV SCREEN (HIV 1/2 AB+AG)
HIV 1/2 Antibodies: NONREACTIVE
HIV-1 P24 Antigen - HIV24: NONREACTIVE

## 2023-02-13 LAB — VITAMIN B12: Vitamin B-12: 146 pg/mL — ABNORMAL LOW (ref 180–914)

## 2023-02-13 LAB — VITAMIN D 25 HYDROXY (VIT D DEFICIENCY, FRACTURES): Vit D, 25-Hydroxy: 10.26 ng/mL — ABNORMAL LOW (ref 30–100)

## 2023-02-13 MED ORDER — SODIUM CHLORIDE 0.9 % IV SOLN: INTRAVENOUS | Status: AC

## 2023-02-13 MED ORDER — GUAIFENESIN 100 MG/5ML PO LIQD: 5.0000 mL | ORAL | Status: DC | PRN

## 2023-02-13 MED ORDER — MAGNESIUM SULFATE 2 GM/50ML IV SOLN
2.0000 g | Freq: Once | INTRAVENOUS | Status: AC
  Administered 2023-02-13: 2 g via INTRAVENOUS
  Filled 2023-02-13: qty 50

## 2023-02-13 MED ORDER — SENNOSIDES-DOCUSATE SODIUM 8.6-50 MG PO TABS: 1.0000 | ORAL_TABLET | Freq: Every evening | ORAL | Status: DC | PRN

## 2023-02-13 MED ORDER — LORAZEPAM 2 MG/ML IJ SOLN
2.0000 mg | Freq: Once | INTRAMUSCULAR | Status: AC | PRN
  Administered 2023-02-14: 2 mg via INTRAVENOUS
  Filled 2023-02-13: qty 1

## 2023-02-13 MED ORDER — LORAZEPAM 2 MG/ML IJ SOLN
2.0000 mg | INTRAMUSCULAR | Status: DC | PRN
  Administered 2023-02-13 – 2023-02-17 (×6): 2 mg via INTRAVENOUS
  Filled 2023-02-13 (×6): qty 1

## 2023-02-13 MED ORDER — IPRATROPIUM-ALBUTEROL 0.5-2.5 (3) MG/3ML IN SOLN: 3.0000 mL | RESPIRATORY_TRACT | Status: DC | PRN

## 2023-02-13 MED ORDER — HYDRALAZINE HCL 20 MG/ML IJ SOLN: 10.0000 mg | INTRAMUSCULAR | Status: DC | PRN

## 2023-02-13 MED ORDER — METOPROLOL TARTRATE 5 MG/5ML IV SOLN: 5.0000 mg | INTRAVENOUS | Status: DC | PRN

## 2023-02-13 MED ORDER — POTASSIUM CHLORIDE CRYS ER 20 MEQ PO TBCR
40.0000 meq | EXTENDED_RELEASE_TABLET | Freq: Once | ORAL | Status: AC
  Administered 2023-02-13: 40 meq via ORAL
  Filled 2023-02-13: qty 2

## 2023-02-13 MED ORDER — LORAZEPAM 2 MG/ML IJ SOLN: 2.0000 mg | Freq: Four times a day (QID) | INTRAMUSCULAR | Status: DC | PRN

## 2023-02-13 NOTE — Progress Notes (Addendum)
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  Recently started on transvaginal estrogen.  Initially in the ED CT head and MRI brain were negative but did show chronic cerebral atrophy.  Patient was sent home but continued to have symptoms therefore returned back to the ED.  Repeat MRI again remains unremarkable for acute pathology.  Neurology recommended metabolic workup and ordered ANA, antiphospholipid, ceruloplasmin, copper and vitamin D levels.   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  Metabolic workup sent including ANA, antiphospholipid, ceruloplasmin, copper and vitamin D levels.  Ativan challenge. Will defer the need for lumbar puncture to neurology. HIV screen  I have some suspicion that this could be psychogenic. Will Consult Psych as well   Hypokalemia/hypomagnesemia - As needed repletion  Small pulmonary nodule - Outpatient follow-up with PCP   DVT prophylaxis: enoxaparin (LOVENOX) injection 40 mg Start: 02/12/23 2200 Code Status: Full Code Family Communication:   Status is: Observation On going eval for hemichorea      Diet Orders (From admission, onward)     Start     Ordered   02/12/23 1737  Diet regular Room service appropriate? Yes; Fluid consistency: Thin  Diet effective now       Question Answer Comment  Room service appropriate? Yes   Fluid consistency: Thin      02/12/23 1737            Subjective: Patient seen and examined at bedside.  Having significant movement of her right upper and lower extremity. Tells me recently she has been undergoing  some family related stress at home. Denies having similar symptoms in the past   Examination:  General exam: Appears calm and comfortable  Respiratory system: Clear to auscultation. Respiratory effort normal. Cardiovascular system: S1 & S2 heard, RRR. No JVD, murmurs, rubs, gallops or clicks. No pedal edema. Gastrointestinal system: Abdomen is nondistended, soft and nontender. No organomegaly or masses felt. Normal bowel sounds heard. Central nervous system: Alert and oriented.  Irregular rhythmic movement of the right upper and lower extremities Extremities: Symmetric 5 x 5 power. Skin: No rashes, lesions or ulcers Psychiatry: Judgement and insight appear normal. Mood & affect appropriate.  Objective: Vitals:   02/13/23 0130 02/13/23 0300 02/13/23 0530 02/13/23 0730  BP: (!) 95/50  130/73 130/86  Pulse: (!) 57 (!) 57 74 78  Resp: 17 16 15  (!) 25  Temp:    98 F (36.7 C)  TempSrc:    Oral  SpO2: 91% 91% 94% 96%  Weight:      Height:       No intake or output data in the 24 hours ending 02/13/23 0842 Filed Weights   02/12/23 1453  Weight: 99.8 kg    Scheduled Meds:  enoxaparin (LOVENOX) injection  40 mg Subcutaneous Q24H   Continuous Infusions:  Nutritional status     Body mass index is 26.09 kg/m.  Data Reviewed:   CBC: Recent Labs  Lab 02/11/23 1352 02/12/23 1503  WBC 6.4 7.0  NEUTROABS 4.3  --   HGB 14.1 13.5  HCT 42.1 40.8  MCV  90.0 89.9  PLT 217 228   Basic Metabolic Panel: Recent Labs  Lab 02/11/23 1352 02/12/23 1503 02/13/23 0540  NA 137 137 138  K 3.8 3.7 3.4*  CL 105 105 108  CO2 24 26 23   GLUCOSE 96 96 104*  BUN 8 9 9   CREATININE 1.06* 1.18* 1.06*  CALCIUM 9.1 9.0 8.9  MG  --  1.6* 2.4   GFR: Estimated Creatinine Clearance: 68.8 mL/min (A) (by C-G formula based on SCr of 1.06 mg/dL (H)). Liver Function Tests: Recent Labs  Lab 02/11/23 1352 02/12/23 1503 02/13/23 0540  AST 19 21 20   ALT 10 10 11   ALKPHOS 75 67 65  BILITOT  1.5* 1.0 1.1  PROT 7.2 6.4* 6.5  ALBUMIN 4.2 3.8 3.8   No results for input(s): "LIPASE", "AMYLASE" in the last 168 hours. Recent Labs  Lab 02/11/23 1806  AMMONIA 25   Coagulation Profile: No results for input(s): "INR", "PROTIME" in the last 168 hours. Cardiac Enzymes: Recent Labs  Lab 02/11/23 1806  CKTOTAL 232   BNP (last 3 results) No results for input(s): "PROBNP" in the last 8760 hours. HbA1C: No results for input(s): "HGBA1C" in the last 72 hours. CBG: Recent Labs  Lab 02/11/23 1353  GLUCAP 93   Lipid Profile: No results for input(s): "CHOL", "HDL", "LDLCALC", "TRIG", "CHOLHDL", "LDLDIRECT" in the last 72 hours. Thyroid Function Tests: Recent Labs    02/12/23 1503  TSH 1.808   Anemia Panel: No results for input(s): "VITAMINB12", "FOLATE", "FERRITIN", "TIBC", "IRON", "RETICCTPCT" in the last 72 hours. Sepsis Labs: No results for input(s): "PROCALCITON", "LATICACIDVEN" in the last 168 hours.  No results found for this or any previous visit (from the past 240 hour(s)).       Radiology Studies: MR Brain W and Wo Contrast  Result Date: 02/12/2023 CLINICAL DATA:  Initial evaluation for headache. Neuro deficit, hemi chorea. EXAM: MRI HEAD WITHOUT AND WITH CONTRAST TECHNIQUE: Multiplanar, multiecho pulse sequences of the brain and surrounding structures were obtained without and with intravenous contrast. CONTRAST:  10mL GADAVIST GADOBUTROL 1 MMOL/ML IV SOLN, 10mL GADAVIST GADOBUTROL 1 MMOL/ML IV SOLN COMPARISON:  Brain MRI from 1 day earlier on 02/11/2023. FINDINGS: Brain: Atrophy with mild chronic small vessel ischemic disease. Few scatter remote lacunar infarcts about the thalami and left cerebellum again noted. No evidence for acute or subacute ischemia. No areas of chronic cortical infarction. No acute or chronic intracranial blood products. No mass lesion, midline shift or mass effect. Mild ventricular prominence related to global parenchymal volume loss of  hydrocephalus. No parenchymal changes of hemichorea/hemiballismus. Pituitary gland and suprasellar region within normal limits. No abnormal enhancement. Vascular: Major intracranial vascular flow voids are maintained. Skull and upper cervical spine: Craniocervical junction within normal limits. Bone marrow signal intensity normal. No scalp soft tissue abnormality. Sinuses/Orbits: Prior ocular lens replacement on the left. Paranasal sinuses are clear. No mastoid effusion. Other: None. IMPRESSION: 1. Stable brain MRI.  No acute intracranial abnormality. 2. Atrophy with mild chronic small vessel ischemic disease, with a few scattered remote lacunar infarcts about the thalami and left cerebellum. Electronically Signed   By: Rise Mu M.D.   On: 02/12/2023 19:45   CT CHEST WO CONTRAST  Result Date: 02/12/2023 CLINICAL DATA:  Pulmonary nodules EXAM: CT CHEST WITHOUT CONTRAST TECHNIQUE: Multidetector CT imaging of the chest was performed following the standard protocol without IV contrast. RADIATION DOSE REDUCTION: This exam was performed according to the departmental dose-optimization program which includes automated exposure control, adjustment  of the mA and/or kV according to patient size and/or use of iterative reconstruction technique. COMPARISON:  01/05/2021 FINDINGS: Cardiovascular: Coronary, aortic arch, and branch vessel atherosclerotic vascular disease. There is a focus of mitral valve calcification. Mediastinum/Nodes: Small type 1 hiatal hernia. Lungs/Pleura: Biapical pleuroparenchymal scarring. Cylindrical bronchiectasis with some airway plugging in the right middle lobe. Small pulmonary nodules are present. This includes a 5 by 4 mm right lower lobe pulmonary nodule on image 140 series 4 unchanged from 12/15/2009, and hence definitively benign. There is also a 5 by 3 by 2 mm (volume = 20 mm^3) left lower lobe pulmonary nodule on image 114 series 4 which is unchanged from 01/05/2021, compatible  with benign etiology. No new or enlarging nodule identified. Upper Abdomen: Abdominal aortic atherosclerosis. Musculoskeletal: Mild thoracic spondylosis. IMPRESSION: 1. Small pulmonary nodules are unchanged and considered benign. These nodules do not require specific follow up. However, this does not necessarily mean that further screening screening assessments should be discontinued. 2. Cylindrical bronchiectasis with some airway plugging in the right middle lobe. 3. Small type 1 hiatal hernia. 4. Coronary, aortic arch, and branch vessel atherosclerotic vascular disease. Calcification of the mitral valve. 5. Aortic atherosclerosis. Aortic Atherosclerosis (ICD10-I70.0). Electronically Signed   By: Gaylyn Rong M.D.   On: 02/12/2023 18:53   MR Brain W and Wo Contrast  Result Date: 02/11/2023 CLINICAL DATA:  Initial evaluation for headache, neuro deficit. EXAM: MRI HEAD WITHOUT AND WITH CONTRAST TECHNIQUE: Multiplanar, multiecho pulse sequences of the brain and surrounding structures were obtained without and with intravenous contrast. CONTRAST:  10mL GADAVIST GADOBUTROL 1 MMOL/ML IV SOLN COMPARISON:  CT from earlier the same day. FINDINGS: Brain: Diffuse prominence of the CSF containing spaces compatible generalized cerebral atrophy. Patchy T2/FLAIR hyperintensity involving the periventricular and deep white matter both cerebral hemispheres, consistent with chronic small vessel ischemic disease, mild in nature. Few scatter remote lacunar infarcts present about the thalami. Small remote left cerebellar infarct noted. No evidence for acute or subacute ischemia. Gray-white matter differentiation maintained. No areas of chronic cortical infarction. No acute or chronic intracranial blood products. No mass lesion, midline shift or mass effect. Mild ventricular prominence related to global parenchymal volume loss without hydrocephalus. No extra-axial fluid collection. Pituitary gland and suprasellar region within  normal limits. Vascular: Major intracranial vascular flow voids are maintained. Skull and upper cervical spine: Craniocervical junction within normal limits. Bone marrow signal intensity normal. No scalp soft tissue abnormality. Sinuses/Orbits: Prior ocular lens replacement on the left. Paranasal sinuses are largely clear. No significant mastoid effusion. Other: None. IMPRESSION: 1. No acute intracranial abnormality. 2. 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 Electronically Signed   By: Rise Mu M.D.   On: 02/11/2023 19:15   CT Head Wo Contrast  Result Date: 02/11/2023 CLINICAL DATA:  Mental status change EXAM: CT HEAD WITHOUT CONTRAST TECHNIQUE: Contiguous axial images were obtained from the base of the skull through the vertex without intravenous contrast. RADIATION DOSE REDUCTION: This exam was performed according to the departmental dose-optimization program which includes automated exposure control, adjustment of the mA and/or kV according to patient size and/or use of iterative reconstruction technique. COMPARISON:  None Available. FINDINGS: Brain: No subdural, epidural, or subarachnoid hemorrhage. No mass effect or midline shift. Cerebellum demonstrates a probable lacunar infarct in the left cerebellar hemisphere best seen image 54 and axial image 6. Cerebellum otherwise normal. Brainstem is normal. Basal cisterns  are unremarkable. Ventricles and sulci are prominent but otherwise unremarkable. Mild white matter changes. No acute infarct or ischemia. Vascular: Calcified atherosclerotic changes in the intracranial carotids. Skull: Normal. Negative for fracture or focal lesion. Sinuses/Orbits: No acute finding. Other: No other abnormalities. IMPRESSION: 1. 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 2. Atrophy and white matter changes. 3. Calcified  atherosclerotic changes in the intracranial carotids. Electronically Signed   By: Gerome Sam III M.D.   On: 02/11/2023 14:18           LOS: 0 days   Time spent= 35 mins    [place] Joline Maxcy, MD [facility]  If 7PM-7AM, please contact night-coverage  02/13/2023, 8:42 AM

## 2023-02-13 NOTE — ED Notes (Signed)
Pt instructed to keep R arm still lying on bed for blood pressure reading. Pt able to do so without difficulty.

## 2023-02-13 NOTE — Consult Note (Signed)
[facility] Face-to-Face Psychiatry Consult   Reason for Consult: New onset movement disorder of unclear etiology.  Question about possibility of it being "psychogenic". Referring Physician:  Nelson Chimes Patient Identification: Felicia Dunn MRN:  [id number] Principal Diagnosis: Hemichorea Diagnosis:  Principal Problem:   Hemichorea   Total Time spent with patient: 45 minutes  Subjective:   Felicia Dunn is a 67 y.o. female patient admitted with "this is not me".  HPI: Patient seen and chart reviewed.  Patient has already been seen by several providers.  History that I obtained is the same as what is in the chart.  67 year old woman reports new onset approximately a week ago of irregular involuntary movements primarily of her right side.  Patient is lying in bed when we came in and the movements were mostly continuous.  She has movements in her shoulder and right arm as well as in her right hip.  Patient reports that with concentration she is able to diminish the movements.  When she uses muscle groups for intentional acts it appears that the movements diminish or disappear.  She demonstrated that she was able to hold a cup steady in her right hand.  Also reports that she has no trouble walking.  Patient and husband both report that at the same time these symptoms started she began having some problems with short-term memory.  Husband describes several episodes of remarkable memory or concentration problems recently that seemed out of the ordinary.  Patient agrees that she has noticed some of it.  She does not describe any mood symptoms.  Affect actually seems somewhat more casual than 1 might expect in the situation.  No report of hallucinations or psychotic symptoms.  No report of substance abuse.  As noted elsewhere the only medication change anytime recently with starting transvaginal estrogen about a month ago.  Patient denies any recent psychological or emotional traumas.  Past Psychiatric History: No  past psychiatric history at all.  No history of depression and no history of treatment by mental health provider.  No past history noted of the use of any kind of psychiatric related medication.  Risk to Self:   Risk to Others:   Prior Inpatient Therapy:   Prior Outpatient Therapy:    Past Medical History:  Past Medical History:  Diagnosis Date   Arthritis    right knee   Vertigo    ?orthostatic    Past Surgical History:  Procedure Laterality Date   APPENDECTOMY     CATARACT EXTRACTION W/PHACO Left 09/13/2016   Procedure: CATARACT EXTRACTION PHACO AND INTRAOCULAR LENS PLACEMENT (IOC);  Surgeon: Nevada Crane, MD;  Location: [facility];  Service: Ophthalmology;  Laterality: Left;  SYMFONY TORIC LENS LEFT   DILATION AND CURETTAGE OF UTERUS     Family History: History reviewed. No pertinent family history. Family Psychiatric  History: Patient denies knowing of any family members who have had similar kinds of movements or any movement problems at all. Social History:  Social History   Substance and Sexual Activity  Alcohol Use Not Currently   Comment: 2 glass wine/month     Social History   Substance and Sexual Activity  Drug Use No    Social History   Socioeconomic History   Marital status: Married    Spouse name: Not on file   Number of children: Not on file   Years of education: Not on file   Highest education level: Not on file  Occupational History   Not on  file  Tobacco Use   Smoking status: Former    Packs/day: 1.50    Years: 43.00    Additional pack years: 0.00    Total pack years: 64.50    Types: Cigarettes    Quit date: 12/02/2012    Years since quitting: 10.2   Smokeless tobacco: Never  Vaping Use   Vaping Use: Every day   Substances: Nicotine  Substance and Sexual Activity   Alcohol use: Not Currently    Comment: 2 glass wine/month   Drug use: No   Sexual activity: Not Currently  Other Topics Concern   Not on file  Social History  Narrative   Not on file   Social Determinants of Health   Financial Resource Strain: Not on file  Food Insecurity: No Food Insecurity (02/13/2023)   Hunger Vital Sign    Worried About Running Out of Food in the Last Year: Never true    Ran Out of Food in the Last Year: Never true  Transportation Needs: No Transportation Needs (02/13/2023)   PRAPARE - Administrator, Civil Service (Medical): No    Lack of Transportation (Non-Medical): No  Physical Activity: Not on file  Stress: Not on file  Social Connections: Not on file   Additional Social History:    Allergies:  No Known Allergies  Labs:  Results for orders placed or performed during the hospital encounter of 02/12/23 (from the past 48 hour(s))  Basic metabolic panel     Status: Abnormal   Collection Time: 02/12/23  3:03 PM  Result Value Ref Range   Sodium 137 135 - 145 mmol/L   Potassium 3.7 3.5 - 5.1 mmol/L   Chloride 105 98 - 111 mmol/L   CO2 26 22 - 32 mmol/L   Glucose, Bld 96 70 - 99 mg/dL    Comment: Glucose reference range applies only to samples taken after fasting for at least 8 hours.   BUN 9 8 - 23 mg/dL   Creatinine, Ser 1.61 (H) 0.44 - 1.00 mg/dL   Calcium 9.0 8.9 - 09.6 mg/dL   GFR, Estimated 51 (L) >60 mL/min    Comment: (NOTE) Calculated using the CKD-EPI Creatinine Equation (2021)    Anion gap 6 5 - 15    Comment: Performed at [facility], [address] [place], [place] [address]  CBC     Status: None   Collection Time: 02/12/23  3:03 PM  Result Value Ref Range   WBC 7.0 4.0 - 10.5 K/uL   RBC 4.54 3.87 - 5.11 MIL/uL   Hemoglobin 13.5 12.0 - 15.0 g/dL   HCT 98.1 19.1 - 47.8 %   MCV 89.9 80.0 - 100.0 fL   MCH 29.7 26.0 - 34.0 pg   MCHC 33.1 30.0 - 36.0 g/dL   RDW 29.5 62.1 - 30.8 %   Platelets 228 150 - 400 K/uL   nRBC 0.0 0.0 - 0.2 %    Comment: Performed at [facility], [address] [place], [place] [address]  Magnesium     Status: Abnormal   Collection  Time: 02/12/23  3:03 PM  Result Value Ref Range   Magnesium 1.6 (L) 1.7 - 2.4 mg/dL    Comment: Performed at [facility], [address] [place], [place] [address]  TSH     Status: None   Collection Time: 02/12/23  3:03 PM  Result Value Ref Range   TSH 1.808 0.350 - 4.500 uIU/mL    Comment:  Performed by a 3rd Generation assay with a functional sensitivity of <=0.01 uIU/mL. Performed at [facility], [address] [place], [place] [address]   Hepatic function panel     Status: Abnormal   Collection Time: 02/12/23  3:03 PM  Result Value Ref Range   Total Protein 6.4 (L) 6.5 - 8.1 g/dL   Albumin 3.8 3.5 - 5.0 g/dL   AST 21 15 - 41 U/L   ALT 10 0 - 44 U/L   Alkaline Phosphatase 67 38 - 126 U/L   Total Bilirubin 1.0 0.3 - 1.2 mg/dL   Bilirubin, Direct 0.2 0.0 - 0.2 mg/dL   Indirect Bilirubin 0.8 0.3 - 0.9 mg/dL    Comment: Performed at [facility], [address] [place], [place] [address]  Comprehensive metabolic panel     Status: Abnormal   Collection Time: 02/13/23  5:40 AM  Result Value Ref Range   Sodium 138 135 - 145 mmol/L   Potassium 3.4 (L) 3.5 - 5.1 mmol/L   Chloride 108 98 - 111 mmol/L   CO2 23 22 - 32 mmol/L   Glucose, Bld 104 (H) 70 - 99 mg/dL    Comment: Glucose reference range applies only to samples taken after fasting for at least 8 hours.   BUN 9 8 - 23 mg/dL   Creatinine, Ser 2.13 (H) 0.44 - 1.00 mg/dL   Calcium 8.9 8.9 - 08.6 mg/dL   Total Protein 6.5 6.5 - 8.1 g/dL   Albumin 3.8 3.5 - 5.0 g/dL   AST 20 15 - 41 U/L   ALT 11 0 - 44 U/L   Alkaline Phosphatase 65 38 - 126 U/L   Total Bilirubin 1.1 0.3 - 1.2 mg/dL   GFR, Estimated 58 (L) >60 mL/min    Comment: (NOTE) Calculated using the CKD-EPI Creatinine Equation (2021)    Anion gap 7 5 - 15    Comment: Performed at [facility], [address], [place], [place] [address]  Magnesium     Status: None   Collection Time: 02/13/23  5:40 AM  Result Value Ref Range    Magnesium 2.4 1.7 - 2.4 mg/dL    Comment: Performed at [facility], [address] [place], [place] [address]  TSH     Status: None   Collection Time: 02/13/23 10:30 AM  Result Value Ref Range   TSH 1.690 0.350 - 4.500 uIU/mL    Comment: Performed by a 3rd Generation assay with a functional sensitivity of <=0.01 uIU/mL. Performed at [facility], [address] [place], [place] [address]   Rapid HIV screen (HIV 1/2 Ab+Ag)     Status: None   Collection Time: 02/13/23 10:30 AM  Result Value Ref Range   HIV-1 P24 Antigen - HIV24 NON REACTIVE NON REACTIVE    Comment: (NOTE) Detection of p24 may be inhibited by biotin in the sample, causing false negative results in acute infection.    HIV 1/2 Antibodies NON REACTIVE NON REACTIVE   Interpretation (HIV Ag Ab)      A non reactive test result means that HIV 1 or HIV 2 antibodies and HIV 1 p24 antigen were not detected in the specimen.    Comment: Performed at [facility], [address] [place], [place] [address]    Current Facility-Administered Medications  Medication Dose Route Frequency Provider Last Rate Last Admin   0.9 %  sodium chloride infusion   Intravenous Continuous Amin, Loura Halt, MD 75 mL/hr  at 02/13/23 0939 New Bag at 02/13/23 0939   acetaminophen (TYLENOL) tablet 650 mg  650 mg Oral Q6H PRN Verdene Lennert, MD       Or   acetaminophen (TYLENOL) suppository 650 mg  650 mg Rectal Q6H PRN Verdene Lennert, MD       enoxaparin (LOVENOX) injection 40 mg  40 mg Subcutaneous Q24H Verdene Lennert, MD   40 mg at 02/12/23 2117   guaiFENesin (ROBITUSSIN) 100 MG/5ML liquid 5 mL  5 mL Oral Q4H PRN Amin, Ankit Chirag, MD       hydrALAZINE (APRESOLINE) injection 10 mg  10 mg Intravenous Q4H PRN Amin, Ankit Chirag, MD       ipratropium-albuterol (DUONEB) 0.5-2.5 (3) MG/3ML nebulizer solution 3 mL  3 mL Nebulization Q4H PRN Amin, Ankit Chirag, MD       LORazepam (ATIVAN) injection 2 mg  2 mg Intravenous  Q3H PRN Amin, Loura Halt, MD   2 mg at 02/13/23 1224   metoprolol tartrate (LOPRESSOR) injection 5 mg  5 mg Intravenous Q4H PRN Amin, Ankit Chirag, MD       ondansetron (ZOFRAN) tablet 4 mg  4 mg Oral Q6H PRN Verdene Lennert, MD       Or   ondansetron (ZOFRAN) injection 4 mg  4 mg Intravenous Q6H PRN Verdene Lennert, MD       polyethylene glycol (MIRALAX / GLYCOLAX) packet 17 g  17 g Oral Daily PRN Verdene Lennert, MD       senna-docusate (Senokot-S) tablet 1 tablet  1 tablet Oral QHS PRN Amin, Loura Halt, MD       Current Outpatient Medications  Medication Sig Dispense Refill   Estradiol 10 MCG TABS vaginal tablet Place 1 tablet vaginally 2 (two) times a week.     meloxicam (MOBIC) 15 MG tablet Take 15 mg by mouth daily.      Musculoskeletal: Strength & Muscle Tone: within normal limits Gait & Station:  Did not test gait but patient reports that she is able to walk without unsteadiness Patient leans: N/A            Psychiatric Specialty Exam:  Presentation  General Appearance: No data recorded Eye Contact:No data recorded Speech:No data recorded Speech Volume:No data recorded Handedness:No data recorded  Mood and Affect  Mood:No data recorded Affect:No data recorded  Thought Process  Thought Processes:No data recorded Descriptions of Associations:No data recorded Orientation:No data recorded Thought Content:No data recorded History of Schizophrenia/Schizoaffective disorder:No data recorded Duration of Psychotic Symptoms:No data recorded Hallucinations:No data recorded Ideas of Reference:No data recorded Suicidal Thoughts:No data recorded Homicidal Thoughts:No data recorded  Sensorium  Memory:No data recorded Judgment:No data recorded Insight:No data recorded  Executive Functions  Concentration:No data recorded Attention Span:No data recorded Recall:No data recorded Fund of Knowledge:No data recorded Language:No data recorded  Psychomotor Activity   Psychomotor Activity:No data recorded  Assets  Assets:No data recorded  Sleep  Sleep:No data recorded  Physical Exam: Physical Exam Constitutional:      Appearance: Normal appearance.  HENT:     Head: Normocephalic and atraumatic.     Mouth/Throat:     Pharynx: Oropharynx is clear.  Eyes:     Pupils: Pupils are equal, round, and reactive to light.  Cardiovascular:     Rate and Rhythm: Normal rate and regular rhythm.  Pulmonary:     Effort: Pulmonary effort is normal.     Breath sounds: Normal breath sounds.  Abdominal:     General: Abdomen is flat.  Palpations: Abdomen is soft.  Musculoskeletal:        General: Normal range of motion.  Skin:    General: Skin is warm and dry.  Neurological:     Mental Status: She is alert.     Comments: Patient displays irregular movements of large muscles on the right side of the body most obviously involving the shoulder and right arm as well as the right hip.  She is able to control the movements with concentration and they diminish with intentional movement.  Psychiatric:        Attention and Perception: Attention normal.        Mood and Affect: Mood normal.        Speech: Speech normal.        Behavior: Behavior normal.        Thought Content: Thought content normal.        Cognition and Memory: Memory is impaired.        Judgment: Judgment normal.    Review of Systems  Constitutional: Negative.   HENT: Negative.    Eyes: Negative.   Respiratory: Negative.    Cardiovascular: Negative.   Gastrointestinal: Negative.   Musculoskeletal: Negative.   Skin: Negative.   Neurological: Negative.        Irregular movements somewhat choreoathetotic in nature primarily of the right side  Psychiatric/Behavioral:  Positive for memory loss. Negative for depression, hallucinations, substance abuse and suicidal ideas. The patient is not nervous/anxious and does not have insomnia.    Blood pressure 113/81, pulse 76, temperature 98.2 F  (36.8 C), temperature source Oral, resp. rate 20, height 6\' 5"  (1.956 m), weight 99.8 kg, SpO2 97 %. Body mass index is 26.09 kg/m.  Treatment Plan Summary: 67 year old woman with new onset of choreoathetotic movements of the right side of the body.  Appears to be completely sparing the left side of the body.  I reviewed the note from neurology.  I do not think psychiatry has anything to add at this point.  There is no evidence of any psychiatric disorder or any reason that I can see to think that this would be "psychogenic".  So-called "conversion" symptoms are possible although this would be a very unusual presentation for 1.  Psychiatry will sign off for now.  If we can be of any further assistance please reconsult.  Disposition: No evidence of imminent risk to self or others at present.   No indication for further psychiatric intervention.  Psychiatry team signing off.  Mordecai Rasmussen, MD 02/13/2023 2:09 PM

## 2023-02-13 NOTE — Progress Notes (Signed)
PT Cancellation Note  Patient Details Name: Felicia Dunn MRN: [id number] DOB: 1956-06-01   Cancelled Treatment:    Reason Eval/Treat Not Completed: Fatigue/lethargy limiting ability to participate (Chart reviewed, RN consulted. Per OT, concerns that recent ativan for imaging may be adding new gross motor impairment, as pt was AMB to [facility] twice in ED without difficulty, then with OT unable to stand up.) Pt is slurring words upon my visit, husband indicated he feels she is still being affected by ativan. Will hold off at this time, plan to evaluate at later date/time once medication side effects are thought to be minimal or less.   3:50 PM, 02/13/23 Rosamaria Lints, PT, DPT Physical Therapist - [facility]  [phone] (ASCOM)     Neill Jurewicz C 02/13/2023, 3:49 PM

## 2023-02-13 NOTE — Progress Notes (Signed)
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   Objective: Current vital signs: BP 113/81   Pulse 76   Temp 98.2 F (36.8 C) (Oral)   Resp 20   Ht 6\' 5"  (1.956 m)   Wt 99.8 kg   SpO2 97%   BMI 26.09 kg/m  Vital signs in last 24 hours: Temp:  [98 F (36.7 C)-98.4 F (36.9 C)] 98.2 F (36.8 C) (04/29 1230) Pulse Rate:  [57-91] 76 (04/29 1230) Resp:  [15-25] 20 (04/29 1230) BP: (92-134)/(45-86) 113/81 (04/29 1230) SpO2:  [90 %-97 %] 97 % (04/29 1230) Weight:  [99.8 kg] 99.8 kg (04/28 1453)  Intake/Output from previous day: No intake/output data recorded. Intake/Output this shift: Total I/O In: 50 [IV Piggyback:50] Out: -  Nutritional status:  Diet Order             Diet regular Room service appropriate? Yes; Fluid consistency: Thin  Diet effective now                  Physical Exam HEENT- [place]/AT   Lungs- Respirations unlabored Extremities- Warm and well-perfused  Neurological Examination Mental Status: Awake and alert. Fully oriented. Thought content appropriate.  Speech fluent without evidence of aphasia.  Able to follow all commands without difficulty. Cranial Nerves: II, III,IV, VI: No ptosis. EOMI. No nystagmus. Fixates and tracks normally.  VII: Smile symmetric. Intermittent choreiform movements of right side of face are noted.  VIII: Hearing intact to voice IX,X: No hypophonia or hoarseness XI: Symmetric XII: Midline tongue extension. 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  Sensory:  FT intact x 4.  Cerebellar: No ataxia disproportionate to her chorea on the right.  Gait: Deferred   Lab Results: Results for orders placed or performed during the hospital  encounter of 02/12/23 (from the past 48 hour(s))  Basic metabolic panel     Status: Abnormal   Collection Time: 02/12/23  3:03 PM  Result Value Ref Range   Sodium 137 135 - 145 mmol/L   Potassium 3.7 3.5 - 5.1 mmol/L   Chloride 105 98 - 111 mmol/L   CO2 26 22 - 32 mmol/L   Glucose, Bld 96 70 - 99 mg/dL    Comment: Glucose reference range applies only to samples taken after fasting for at least 8 hours.   BUN 9 8 - 23 mg/dL   Creatinine, Ser 0.45 (H) 0.44 - 1.00 mg/dL   Calcium 9.0 8.9 - 40.9 mg/dL   GFR, Estimated 51 (L) >60 mL/min    Comment: (NOTE) Calculated using the CKD-EPI Creatinine Equation (2021)    Anion gap 6 5 - 15    Comment: Performed at [facility], [address] [place], [place] [address]  CBC     Status: None   Collection Time: 02/12/23  3:03 PM  Result Value Ref Range   WBC 7.0 4.0 - 10.5 K/uL   RBC 4.54 3.87 - 5.11 MIL/uL   Hemoglobin 13.5 12.0 - 15.0 g/dL   HCT 47.8 29.5 - 62.1 %   MCV 89.9 80.0 - 100.0 fL   MCH 29.7 26.0 - 34.0 pg   MCHC 33.1 30.0 - 36.0 g/dL   RDW 30.8 65.7 - 84.6 %   Platelets 228 150 - 400 K/uL   nRBC 0.0  0.0 - 0.2 %    Comment: Performed at [facility], [address] [place], [place] [address]  Magnesium     Status: Abnormal   Collection Time: 02/12/23  3:03 PM  Result Value Ref Range   Magnesium 1.6 (L) 1.7 - 2.4 mg/dL    Comment: Performed at [facility], [address] [place], [place] [address]  TSH     Status: None   Collection Time: 02/12/23  3:03 PM  Result Value Ref Range   TSH 1.808 0.350 - 4.500 uIU/mL    Comment: Performed by a 3rd Generation assay with a functional sensitivity of <=0.01 uIU/mL. Performed at [facility], [address] [place], [place] [address]   Hepatic function panel     Status: Abnormal   Collection Time: 02/12/23  3:03 PM  Result Value Ref Range   Total Protein 6.4 (L) 6.5 - 8.1 g/dL   Albumin 3.8 3.5 - 5.0 g/dL   AST 21 15 - 41 U/L   ALT 10 0  - 44 U/L   Alkaline Phosphatase 67 38 - 126 U/L   Total Bilirubin 1.0 0.3 - 1.2 mg/dL   Bilirubin, Direct 0.2 0.0 - 0.2 mg/dL   Indirect Bilirubin 0.8 0.3 - 0.9 mg/dL    Comment: Performed at [facility], [address] [place], [place] [address]  Comprehensive metabolic panel     Status: Abnormal   Collection Time: 02/13/23  5:40 AM  Result Value Ref Range   Sodium 138 135 - 145 mmol/L   Potassium 3.4 (L) 3.5 - 5.1 mmol/L   Chloride 108 98 - 111 mmol/L   CO2 23 22 - 32 mmol/L   Glucose, Bld 104 (H) 70 - 99 mg/dL    Comment: Glucose reference range applies only to samples taken after fasting for at least 8 hours.   BUN 9 8 - 23 mg/dL   Creatinine, Ser 3.24 (H) 0.44 - 1.00 mg/dL   Calcium 8.9 8.9 - 40.1 mg/dL   Total Protein 6.5 6.5 - 8.1 g/dL   Albumin 3.8 3.5 - 5.0 g/dL   AST 20 15 - 41 U/L   ALT 11 0 - 44 U/L   Alkaline Phosphatase 65 38 - 126 U/L   Total Bilirubin 1.1 0.3 - 1.2 mg/dL   GFR, Estimated 58 (L) >60 mL/min    Comment: (NOTE) Calculated using the CKD-EPI Creatinine Equation (2021)    Anion gap 7 5 - 15    Comment: Performed at [facility], [address] [place], [place] [address]  Magnesium     Status: None   Collection Time: 02/13/23  5:40 AM  Result Value Ref Range   Magnesium 2.4 1.7 - 2.4 mg/dL    Comment: Performed at [facility], [address] [place], [place] [address]  TSH     Status: None   Collection Time: 02/13/23 10:30 AM  Result Value Ref Range   TSH 1.690 0.350 - 4.500 uIU/mL    Comment: Performed by a 3rd Generation assay with a functional sensitivity of <=0.01 uIU/mL. Performed at [facility], [address] [place], [place] [address]   Rapid HIV screen (HIV 1/2 Ab+Ag)     Status: None   Collection Time: 02/13/23 10:30 AM  Result Value Ref Range   HIV-1 P24 Antigen - HIV24 NON REACTIVE NON REACTIVE    Comment: (NOTE) Detection of p24 may be inhibited by biotin in the sample, causing false  negative results in acute infection.    HIV 1/2 Antibodies NON REACTIVE NON REACTIVE   Interpretation (HIV Ag Ab)      A non reactive test result means that HIV 1 or HIV 2 antibodies and HIV 1 p24 antigen were not detected in the specimen.    Comment: Performed at [facility], [address] [place], [place] [address]    No results found for this or any previous visit (from the past 240 hour(s)).  Lipid Panel No results for input(s): "CHOL", "TRIG", "HDL", "CHOLHDL", "VLDL", "LDLCALC" in the last 72 hours.  Studies/Results: MR Brain W and Wo Contrast  Result Date: 02/12/2023 CLINICAL DATA:  Initial evaluation for headache. Neuro deficit, hemi chorea. EXAM: MRI HEAD WITHOUT AND WITH CONTRAST TECHNIQUE: Multiplanar, multiecho pulse sequences of the brain and surrounding structures were obtained without and with intravenous contrast. CONTRAST:  10mL GADAVIST GADOBUTROL 1 MMOL/ML IV SOLN, 10mL GADAVIST GADOBUTROL 1 MMOL/ML IV SOLN COMPARISON:  Brain MRI from 1 day earlier on 02/11/2023. FINDINGS: Brain: Atrophy with mild chronic small vessel ischemic disease. Few scatter remote lacunar infarcts about the thalami and left cerebellum again noted. No evidence for acute or subacute ischemia. No areas of chronic cortical infarction. No acute or chronic intracranial blood products. No mass lesion, midline shift or mass effect. Mild ventricular prominence related to global parenchymal volume loss of hydrocephalus. No parenchymal changes of hemichorea/hemiballismus. Pituitary gland and suprasellar region within normal limits. No abnormal enhancement. Vascular: Major intracranial vascular flow voids are maintained. Skull and upper cervical spine: Craniocervical junction within normal limits. Bone marrow signal intensity normal. No scalp soft tissue abnormality. Sinuses/Orbits: Prior ocular lens replacement on the left. Paranasal sinuses are clear. No mastoid effusion. Other: None. IMPRESSION: 1.  Stable brain MRI.  No acute intracranial abnormality. 2. Atrophy with mild chronic small vessel ischemic disease, with a few scattered remote lacunar infarcts about the thalami and left cerebellum. Electronically Signed   By: Rise Mu M.D.   On: 02/12/2023 19:45   CT CHEST WO CONTRAST  Result Date: 02/12/2023 CLINICAL DATA:  Pulmonary nodules EXAM: CT CHEST WITHOUT CONTRAST TECHNIQUE: Multidetector CT imaging of the chest was performed following the standard protocol without IV contrast. RADIATION DOSE REDUCTION: This exam was performed according to the departmental dose-optimization program which includes automated exposure control, adjustment of the mA and/or kV according to patient size and/or use of iterative reconstruction technique. COMPARISON:  01/05/2021 FINDINGS: Cardiovascular: Coronary, aortic arch, and branch vessel atherosclerotic vascular disease. There is a focus of mitral valve calcification. Mediastinum/Nodes: Small type 1 hiatal hernia. Lungs/Pleura: Biapical pleuroparenchymal scarring. Cylindrical bronchiectasis with some airway plugging in the right middle lobe. Small pulmonary nodules are present. This includes a 5 by 4 mm right lower lobe pulmonary nodule on image 140 series 4 unchanged from 12/15/2009, and hence definitively benign. There is also a 5 by 3 by 2 mm (volume = 20 mm^3) left lower lobe pulmonary nodule on image 114 series 4 which is unchanged from 01/05/2021, compatible with benign etiology. No new or enlarging nodule identified. Upper Abdomen: Abdominal aortic atherosclerosis. Musculoskeletal: Mild thoracic spondylosis. IMPRESSION: 1. Small pulmonary nodules are unchanged and considered benign. These nodules do not require specific follow up. However, this does not necessarily mean that further screening screening assessments should be discontinued. 2. Cylindrical bronchiectasis with some airway plugging in the right middle lobe. 3. Small type 1 hiatal hernia. 4.  Coronary, aortic arch, and branch vessel atherosclerotic vascular disease. Calcification of the mitral  valve. 5. Aortic atherosclerosis. Aortic Atherosclerosis (ICD10-I70.0). Electronically Signed   By: Gaylyn Rong M.D.   On: 02/12/2023 18:53   MR Brain W and Wo Contrast  Result Date: 02/11/2023 CLINICAL DATA:  Initial evaluation for headache, neuro deficit. EXAM: MRI HEAD WITHOUT AND WITH CONTRAST TECHNIQUE: Multiplanar, multiecho pulse sequences of the brain and surrounding structures were obtained without and with intravenous contrast. CONTRAST:  10mL GADAVIST GADOBUTROL 1 MMOL/ML IV SOLN COMPARISON:  CT from earlier the same day. FINDINGS: Brain: Diffuse prominence of the CSF containing spaces compatible generalized cerebral atrophy. Patchy T2/FLAIR hyperintensity involving the periventricular and deep white matter both cerebral hemispheres, consistent with chronic small vessel ischemic disease, mild in nature. Few scatter remote lacunar infarcts present about the thalami. Small remote left cerebellar infarct noted. No evidence for acute or subacute ischemia. Gray-white matter differentiation maintained. No areas of chronic cortical infarction. No acute or chronic intracranial blood products. No mass lesion, midline shift or mass effect. Mild ventricular prominence related to global parenchymal volume loss without hydrocephalus. No extra-axial fluid collection. Pituitary gland and suprasellar region within normal limits. Vascular: Major intracranial vascular flow voids are maintained. Skull and upper cervical spine: Craniocervical junction within normal limits. Bone marrow signal intensity normal. No scalp soft tissue abnormality. Sinuses/Orbits: Prior ocular lens replacement on the left. Paranasal sinuses are largely clear. No significant mastoid effusion. Other: None. IMPRESSION: 1. No acute intracranial abnormality. 2. Generalized cerebral atrophy with mild chronic small vessel ischemic disease,  with a few scattered remote lacunar infarcts about the thalami and left cerebellum. Electronically Signed   By: Rise Mu M.D.   On: 02/11/2023 19:15   CT Head Wo Contrast  Result Date: 02/11/2023 CLINICAL DATA:  Mental status change EXAM: CT HEAD WITHOUT CONTRAST TECHNIQUE: Contiguous axial images were obtained from the base of the skull through the vertex without intravenous contrast. RADIATION DOSE REDUCTION: This exam was performed according to the departmental dose-optimization program which includes automated exposure control, adjustment of the mA and/or kV according to patient size and/or use of iterative reconstruction technique. COMPARISON:  None Available. FINDINGS: Brain: No subdural, epidural, or subarachnoid hemorrhage. No mass effect or midline shift. Cerebellum demonstrates a probable lacunar infarct in the left cerebellar hemisphere best seen image 54 and axial image 6. Cerebellum otherwise normal. Brainstem is normal. Basal cisterns are unremarkable. Ventricles and sulci are prominent but otherwise unremarkable. Mild white matter changes. No acute infarct or ischemia. Vascular: Calcified atherosclerotic changes in the intracranial carotids. Skull: Normal. Negative for fracture or focal lesion. Sinuses/Orbits: No acute finding. Other: No other abnormalities. IMPRESSION: 1. 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 2. Atrophy and white matter changes. 3. Calcified atherosclerotic changes in the intracranial carotids. Electronically Signed   By: Gerome Sam III M.D.   On: 02/11/2023 14:18    Medications: Prior to Admission: (Not in a hospital admission)  Scheduled:  enoxaparin (LOVENOX) injection  40 mg Subcutaneous Q24H   Continuous:  sodium chloride 75 mL/hr at 02/13/23 6962    Assessment: 67 yo female with a PMHx of vertigo who presents to ED with onset of right hemichorea that began one week ago and has been worsening since that time.  - Exam  today is essentially unchanged from yesterday's exam performed by Dr. Selina Cooley. 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  - She is not hyperglycemic and she has no family history of movement disorders. - UDS negative - HIV negative - MRI brain with and  without contrast: No acute intracranial abnormality. Atrophy with mild chronic small vessel ischemic disease and a few scattered remote lacunar infarcts about the thalami and left cerebellum. No acute abnormality.  - CT chest: Small pulmonary nodules are unchanged and considered benign. Cylindrical bronchiectasis with some airway plugging in the right middle lobe.  - DDx: - We suspect culprit may be the transvaginal estrogen she began one month ago. There are cases in the literature of postmenopausal women developing chorea after starting HRT.  - DDx also includes autoimmune or inflammatory processes (SLE, antiphospholipid syndrome, Sjogrens, Wilson's disease, acute HIV infection, B12 deficiency, paraneoplastic syndrome). - If above workup is unrevealing and sx do not improve with stopping estrogen consider LP for further evaluation of potential autoimmune/paraneoplastic etiology.     Recommendations:  - Transvaginal estrogen has been stopped. Continue off this medication and other estrogen compounds indefinitely.  - Recommended labs: - ANA comprehensive panel with reflex to include anti-Ro and anti-La  - Antiphospholipid panel  - Ceruloplasmin, serum copper,  B12 - Will need fluoro-guided LP for further evaluation for potential autoimmune/paraneoplastic etiology (ordered). CSF labs have been ordered: Cell count with differential, protein, glucose, IgG index, oligoclonal bands and VZV PCR.  - Serum paraneoplastic panel has been ordered.  - NPO after midnight. Switch from Lovenox to SCDs for DVT prophylaxis.   Addendum: - Vitamin D is low at 10.26. The literature documents cases of hyperkinetic movement disorders including  hemichorea, in association with vitamin D deficiency. Will need supplementation.  - Vitamin B12 is low at 146. The literature also documents an association between B12 deficiency and the development of chorea. Will need supplemental high-dose B12 at 2000 mcg po per day.      LOS: 0 days   @Electronically  signed: Dr. Caryl Pina 02/13/2023  1:27 PM

## 2023-02-13 NOTE — ED Notes (Addendum)
Pt observed from doorway to room eating breakfast and using R hand without difficulty. When this RN enters room for med administration, pt began having movements of RUE.

## 2023-02-13 NOTE — Progress Notes (Addendum)
PHARMACY CONSULT NOTE   Pharmacy Consult for Electrolyte Monitoring and Replacement   Recent Labs: Potassium (mmol/L)  Date Value  02/13/2023 3.4 (L)   Magnesium (mg/dL)  Date Value  09/81/1914 2.4   Calcium (mg/dL)  Date Value  78/29/5621 8.9   Albumin (g/dL)  Date Value  30/86/5784 3.8   Sodium (mmol/L)  Date Value  02/13/2023 138     Assessment: 67 year old female admitted with hemichorea. PMH includes stress incontinence, arthritis, vertigo.   Goal of Therapy:  Electrolytes within normal limits  Plan:  K+ 3.4. KCl PO 40 meq x 1 ordered by medical team Follow up BMP and Mag tomorrow AM    Elliot Gurney, PharmD, BCPS Clinical Pharmacist  02/13/2023 8:56 AM

## 2023-02-13 NOTE — Evaluation (Signed)
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  Recently started on transvaginal estrogen.  Initially in the ED CT head and MRI brain were negative but did show chronic cerebral atrophy.  Patient was sent home but continued to have symptoms therefore returned back to the ED.  Repeat MRI again remains unremarkable for acute pathology.   Clinical Impression   Pt was seen for OT evaluation this date. Prior to hospital admission, pt was active and independent. Denies falls. Pt lives with her spouse in 1 story home with 4 STE but has ramp option if needed. Pt presents to acute OT demonstrating impaired ADL performance and functional mobility 2/2 decreased FMC (L worse than R for FTN and HTS; R side intermittent involuntary movements), balance, strength, and safety (See OT problem list). Pt currently requires SBA-CGA for bed mobility with increased effort/time noted to complete, CGA-MIN A for STS from elevated bed, and unable to tolerate marching in place with return to sitting quite suddenly. Also noted LOB in sitting while attempting to don her R sock. Pt returned to supine, citing recent medication making her drowsy and impacting her balance. Pt/spouse educated in home/routines modifications to maximize safety/indep. Pt would benefit from skilled OT services to address noted impairments and functional limitations (see below for any additional details) in order to maximize safety and independence while minimizing falls risk and caregiver burden.     Recommendations for follow up therapy are one component of a multi-disciplinary discharge planning process, led by the attending physician.  Recommendations may be updated based on patient status, additional functional criteria and  insurance authorization.   Assistance Recommended at Discharge Frequent or constant Supervision/Assistance  Patient can return home with the following A little help with walking and/or transfers;A little help with bathing/dressing/bathroom;Assistance with cooking/housework;Assist for transportation;Help with stairs or ramp for entrance    Functional Status Assessment  Patient has had a recent decline in their functional status and demonstrates the ability to make significant improvements in function in a reasonable and predictable amount of time.  Equipment Recommendations  None recommended by OT    Recommendations for Other Services       Precautions / Restrictions Precautions Precautions: Fall Restrictions Weight Bearing Restrictions: No      Mobility Bed Mobility Overal bed mobility: Needs Assistance Bed Mobility: Supine to Sit, Sit to Supine     Supine to sit: Supervision, Min guard Sit to supine: Supervision, Min guard   General bed mobility comments: increased effort, time, use of BUE on bed rails, and use of momentum to get to L side of bed    Transfers Overall transfer level: Needs assistance Equipment used: 1 person hand held assist Transfers: Sit to/from Stand Sit to Stand: Min guard, Min assist                  Balance Overall balance assessment: Needs assistance Sitting-balance support: Single extremity supported, Bilateral upper extremity supported, No upper extremity supported, Feet supported, Feet unsupported Sitting balance-Leahy Scale: Poor Sitting balance - Comments: intermittent LOB to L side, pt at times intentionally doing it because she was tired and other times appears genuine with attempts at donning R sock in sitting   Standing balance support: Single extremity supported, During functional activity, Reliant on assistive device for balance  Standing balance-Leahy Scale: Poor Standing balance comment: handheld assist required, unable to  march in place with LOB and sudden return to sitting EOB                           ADL either performed or assessed with clinical judgement   ADL                                         General ADL Comments: Pt able to doff socks seated EOB with intermittent deficits in dyn sitting balance, CGA-MIN A for sitting balance while donning R sock; able to hold cup with dominant R hand without spillage or dropping, currently requiring assist for any standing ADL     Vision         Perception     Praxis      Pertinent Vitals/Pain Pain Assessment Pain Assessment: No/denies pain     Hand Dominance Right   Extremity/Trunk Assessment Upper Extremity Assessment Upper Extremity Assessment: RUE deficits/detail;LUE deficits/detail RUE Deficits / Details: FTN intact, thumb opposition intact, 4+/5 strength, intermittent mild involuntary shoulder elevation at times, able to control well with intentional movements, able to hold a cup, sensation intact LUE Deficits / Details: grossly 4+/5, thumb opposition intact, FTN mildly impaired compared to RUE, sensation intact LUE Coordination: decreased fine motor   Lower Extremity Assessment Lower Extremity Assessment: RLE deficits/detail;LLE deficits/detail RLE Deficits / Details: hip flexion 3+/5, DF/PF 5/5, sensation intact, HTS intact LLE Deficits / Details: hip flexion 4/5, DF/PF 5/5, sensation intact, HTS mildly impaired LLE Coordination: decreased fine motor       Communication Communication Communication: No difficulties   Cognition Arousal/Alertness: Awake/alert, Suspect due to medications Behavior During Therapy: Restless Overall Cognitive Status: Within Functional Limits for tasks assessed                                 General Comments: Easily distracted requiring cues, intermittent cues required for safety/maintaining balance as she promptly chose to go from sitting to sidelying without  warning.     General Comments       Exercises Other Exercises Other Exercises: Pt/spouse educated in home/routines modifications to maximize safety given balance deficits at this time.   Shoulder Instructions      Home Living Family/patient expects to be discharged to:: Private residence Living Arrangements: Spouse/significant other Available Help at Discharge: Family;Available 24 hours/day (son doesn't work and could be there 24/7 if needed) Type of Home: House Home Access: Stairs to enter;Ramped entrance [employer] of Steps: front 4 steps with R/L rails, back 4 steps with bilateral rails and can reach both; also has aluminum ramp they can set up of needed   Home Layout: One level     Bathroom Shower/Tub: Producer, television/film/video: Standard     Home Equipment: Agricultural consultant (2 wheels);Rollator (4 wheels);Cane - single point;BSC/3in1;Shower seat;Grab bars - tub/shower;Wheelchair - manual          Prior Functioning/Environment Prior Level of Function : Independent/Modified Independent                        OT Problem List: Decreased strength;Decreased coordination;Decreased safety awareness;Impaired balance (sitting and/or standing);Decreased knowledge of use of DME or AE;Impaired UE functional use  OT Treatment/Interventions: Self-care/ADL training;Therapeutic exercise;Therapeutic activities;Neuromuscular education;DME and/or AE instruction;Patient/family education;Balance training    OT Goals(Current goals can be found in the care plan section) Acute Rehab OT Goals Patient Stated Goal: get better OT Goal Formulation: With patient/family Time For Goal Achievement: 02/27/23 Potential to Achieve Goals: Good ADL Goals Pt Will Perform Upper Body Dressing: Independently Pt Will Perform Lower Body Dressing: Independently Pt Will Transfer to Toilet: Independently Pt Will Perform Toileting - Clothing Manipulation and hygiene:  Independently Additional ADL Goal #1: Pt will complete [facility] ADL tasks with no difficulty/spillage/dropping items, 5/5 opportunities.  OT Frequency: Min 1X/week    Co-evaluation              AM-PAC OT "6 Clicks" Daily Activity     Outcome Measure Help from another person eating meals?: A Little Help from another person taking care of personal grooming?: A Little Help from another person toileting, which includes using toliet, bedpan, or urinal?: A Lot Help from another person bathing (including washing, rinsing, drying)?: A Lot Help from another person to put on and taking off regular upper body clothing?: A Little Help from another person to put on and taking off regular lower body clothing?: A Lot 6 Click Score: 15   End of Session Equipment Utilized During Treatment: Gait belt Nurse Communication: Mobility status  Activity Tolerance: Patient limited by fatigue (pt endorsing medication making her drowsy) Patient left: in bed;with call bell/phone within reach;with bed alarm set;with family/visitor present  OT Visit Diagnosis: Other abnormalities of gait and mobility (R26.89)                Time: 0981-1914 OT Time Calculation (min): 31 min Charges:  OT General Charges $OT Visit: 1 Visit OT Evaluation $OT Eval Moderate Complexity: 1 Mod OT Treatments $Self Care/Home Management : 8-22 mins  Arman Filter., MPH, MS, OTR/L ascom [phone] 02/13/23, 3:33 PM

## 2023-02-14 ENCOUNTER — Inpatient Hospital Stay: Payer: Medicare HMO

## 2023-02-14 DIAGNOSIS — G255 Other chorea: Secondary | ICD-10-CM | POA: Diagnosis not present

## 2023-02-14 DIAGNOSIS — R259 Unspecified abnormal involuntary movements: Secondary | ICD-10-CM | POA: Diagnosis not present

## 2023-02-14 LAB — ANA COMPREHENSIVE PANEL
Anti JO-1: <0.2 AI (ref 0.0–0.9)
Centromere Ab Screen: <0.2 AI (ref 0.0–0.9)
Chromatin Ab SerPl-aCnc: <0.2 AI (ref 0.0–0.9)
ENA SM Ab Ser-aCnc: <0.2 AI (ref 0.0–0.9)
Ribonucleic Protein: 1.5 AI — ABNORMAL HIGH (ref 0.0–0.9)
SSA (Ro) (ENA) Antibody, IgG: <0.2 AI (ref 0.0–0.9)
SSB (La) (ENA) Antibody, IgG: <0.2 AI (ref 0.0–0.9)
Scleroderma (Scl-70) (ENA) Antibody, IgG: <0.2 AI (ref 0.0–0.9)
ds DNA Ab: <1 [IU]/mL (ref 0–9)

## 2023-02-14 LAB — CSF CELL COUNT WITH DIFFERENTIAL
Eosinophils, CSF: 0 %
Lymphs, CSF: 50 %
Monocyte-Macrophage-Spinal Fluid: 44 %
RBC Count, CSF: 5 /mm3 — ABNORMAL HIGH (ref 0–3)
Segmented Neutrophils-CSF: 6 %
Tube #: 3
WBC, CSF: 5 /mm3 (ref 0–5)

## 2023-02-14 LAB — CBC
HCT: 38.4 % (ref 36.0–46.0)
Hemoglobin: 13.1 g/dL (ref 12.0–15.0)
MCH: 30.4 pg (ref 26.0–34.0)
MCHC: 34.1 g/dL (ref 30.0–36.0)
MCV: 89.1 fL (ref 80.0–100.0)
Platelets: 190 10*3/uL (ref 150–400)
RBC: 4.31 MIL/uL (ref 3.87–5.11)
RDW: 12.2 % (ref 11.5–15.5)
WBC: 5.7 10*3/uL (ref 4.0–10.5)
nRBC: 0 % (ref 0.0–0.2)

## 2023-02-14 LAB — MAGNESIUM: Magnesium: 2.3 mg/dL (ref 1.7–2.4)

## 2023-02-14 LAB — BASIC METABOLIC PANEL
Anion gap: 4 — ABNORMAL LOW (ref 5–15)
BUN: 8 mg/dL (ref 8–23)
CO2: 26 mmol/L (ref 22–32)
Calcium: 8.3 mg/dL — ABNORMAL LOW (ref 8.9–10.3)
Chloride: 109 mmol/L (ref 98–111)
Creatinine, Ser: 0.87 mg/dL (ref 0.44–1.00)
GFR, Estimated: >60 mL/min (ref >60)
Glucose, Bld: 89 mg/dL (ref 70–99)
Potassium: 3.9 mmol/L (ref 3.5–5.1)
Sodium: 139 mmol/L (ref 135–145)

## 2023-02-14 LAB — PROTEIN AND GLUCOSE, CSF
Glucose, CSF: 50 mg/dL (ref 40–70)
Total  Protein, CSF: 30 mg/dL (ref 15–45)

## 2023-02-14 LAB — CERULOPLASMIN: Ceruloplasmin: 24 mg/dL (ref 19.0–39.0)

## 2023-02-14 MED ORDER — VITAMIN D 25 MCG (1000 UNIT) PO TABS: 2000.0000 [IU] | ORAL_TABLET | Freq: Every day | ORAL | Status: DC

## 2023-02-14 MED ORDER — VITAMIN B-12 1000 MCG PO TABS
2000.0000 ug | ORAL_TABLET | Freq: Every day | ORAL | Status: DC
  Administered 2023-02-14 – 2023-02-20 (×7): 2000 ug via ORAL
  Filled 2023-02-14 (×7): qty 2

## 2023-02-14 MED ORDER — VITAMIN D (ERGOCALCIFEROL) 1.25 MG (50000 UNIT) PO CAPS
50000.0000 [IU] | ORAL_CAPSULE | ORAL | Status: DC
  Administered 2023-02-14: 50000 [IU] via ORAL
  Filled 2023-02-14: qty 1

## 2023-02-14 MED ORDER — LIDOCAINE HCL (PF) 1 % IJ SOLN
10.0000 mL | Freq: Once | INTRAMUSCULAR | Status: AC
  Administered 2023-02-14: 3 mL
  Filled 2023-02-14: qty 10

## 2023-02-14 NOTE — Progress Notes (Signed)
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  Recently started on transvaginal estrogen.  Initially in the ED CT head and MRI brain were negative but did show chronic cerebral atrophy.  Patient was sent home but continued to have symptoms therefore returned back to the ED.  Repeat MRI again remains unremarkable for acute pathology.  Neurology recommended metabolic workup and ordered ANA, antiphospholipid, ceruloplasmin, copper and vitamin D levels.  Psych component ruled out by psychiatry team.  Neurology ordered LP   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  Ongoing metabolic workup including autoimmune by neurology. Will defer the need for lumbar puncture to neurology.  LP ordered HIV screen  Seen by psychiatry, ruled out any underlying psych process   Hypokalemia/hypomagnesemia - As needed repletion  Small pulmonary nodule - Outpatient follow-up with PCP   DVT prophylaxis: Place and maintain sequential compression device Start: 02/13/23 1548 Code Status: Full Code Family Communication: Husband at bedside Status is: Inpatient On going eval for hemichorea      Diet Orders (From admission, onward)     Start     Ordered   02/14/23 0001  Diet NPO time specified  Diet effective midnight        02/13/23 1620            Subjective: During my valuation when I walked in patient was doing okay but soon after started having right upper and lower extremity movements and persisted throughout my visit.  Husband was present at bedside.  No other  complaints   Examination: Constitutional: Not in acute distress Respiratory: Clear to auscultation bilaterally Cardiovascular: Normal sinus rhythm, no rubs Abdomen: Nontender nondistended good bowel sounds Musculoskeletal: No edema noted Skin: No rashes seen Neurologic: Irregular movements of right upper and lower extremity Psychiatric: Normal judgment and insight. Alert and oriented x 3. Normal mood.  Objective: Vitals:   02/13/23 1400 02/13/23 1505 02/13/23 1958 02/14/23 0425  BP: (!) 140/105 126/61 116/70 135/61  Pulse: 92 78 79 65  Resp: 20 16 18 18   Temp: 98 F (36.7 C) 98 F (36.7 C) 97.6 F (36.4 C) (!) 97.5 F (36.4 C)  TempSrc: Oral  Oral Oral  SpO2: 95% 91% 96% 97%  Weight:      Height:        Intake/Output Summary (Last 24 hours) at 02/14/2023 0745 Last data filed at 02/13/2023 1923 Gross per 24 hour  Intake 780 ml  Output --  Net 780 ml   Filed Weights   02/12/23 1453  Weight: 99.8 kg    Scheduled Meds:   Continuous Infusions:  sodium chloride 75 mL/hr at 02/13/23 1923    Nutritional status     Body mass index is 26.09 kg/m.  Data Reviewed:   CBC: Recent Labs  Lab 02/11/23 1352 02/12/23 1503 02/14/23 0621  WBC 6.4 7.0 5.7  NEUTROABS 4.3  --   --   HGB 14.1 13.5 13.1  HCT 42.1 40.8 38.4  MCV 90.0 89.9 89.1  PLT 217 228 190   Basic Metabolic Panel: Recent Labs  Lab 02/11/23 1352 02/12/23  1503 02/13/23 0540 02/14/23 0621  NA 137 137 138 139  K 3.8 3.7 3.4* 3.9  CL 105 105 108 109  CO2 24 26 23 26   GLUCOSE 96 96 104* 89  BUN 8 9 9 8   CREATININE 1.06* 1.18* 1.06* 0.87  CALCIUM 9.1 9.0 8.9 8.3*  MG  --  1.6* 2.4 2.3   GFR: Estimated Creatinine Clearance: 83.8 mL/min (by C-G formula based on SCr of 0.87 mg/dL). Liver Function Tests: Recent Labs  Lab 02/11/23 1352 02/12/23 1503 02/13/23 0540  AST 19 21 20   ALT 10 10 11   ALKPHOS 75 67 65  BILITOT 1.5* 1.0 1.1  PROT 7.2 6.4* 6.5  ALBUMIN 4.2 3.8 3.8   No results for  input(s): "LIPASE", "AMYLASE" in the last 168 hours. Recent Labs  Lab 02/11/23 1806  AMMONIA 25   Coagulation Profile: No results for input(s): "INR", "PROTIME" in the last 168 hours. Cardiac Enzymes: Recent Labs  Lab 02/11/23 1806  CKTOTAL 232   BNP (last 3 results) No results for input(s): "PROBNP" in the last 8760 hours. HbA1C: No results for input(s): "HGBA1C" in the last 72 hours. CBG: Recent Labs  Lab 02/11/23 1353  GLUCAP 93   Lipid Profile: No results for input(s): "CHOL", "HDL", "LDLCALC", "TRIG", "CHOLHDL", "LDLDIRECT" in the last 72 hours. Thyroid Function Tests: Recent Labs    02/13/23 1030  TSH 1.690   Anemia Panel: Recent Labs    02/13/23 1030  VITAMINB12 146*   Sepsis Labs: No results for input(s): "PROCALCITON", "LATICACIDVEN" in the last 168 hours.  No results found for this or any previous visit (from the past 240 hour(s)).       Radiology Studies: MR Brain W and Wo Contrast  Result Date: 02/12/2023 CLINICAL DATA:  Initial evaluation for headache. Neuro deficit, hemi chorea. EXAM: MRI HEAD WITHOUT AND WITH CONTRAST TECHNIQUE: Multiplanar, multiecho pulse sequences of the brain and surrounding structures were obtained without and with intravenous contrast. CONTRAST:  10mL GADAVIST GADOBUTROL 1 MMOL/ML IV SOLN, 10mL GADAVIST GADOBUTROL 1 MMOL/ML IV SOLN COMPARISON:  Brain MRI from 1 day earlier on 02/11/2023. FINDINGS: Brain: Atrophy with mild chronic small vessel ischemic disease. Few scatter remote lacunar infarcts about the thalami and left cerebellum again noted. No evidence for acute or subacute ischemia. No areas of chronic cortical infarction. No acute or chronic intracranial blood products. No mass lesion, midline shift or mass effect. Mild ventricular prominence related to global parenchymal volume loss of hydrocephalus. No parenchymal changes of hemichorea/hemiballismus. Pituitary gland and suprasellar region within normal limits. No  abnormal enhancement. Vascular: Major intracranial vascular flow voids are maintained. Skull and upper cervical spine: Craniocervical junction within normal limits. Bone marrow signal intensity normal. No scalp soft tissue abnormality. Sinuses/Orbits: Prior ocular lens replacement on the left. Paranasal sinuses are clear. No mastoid effusion. Other: None. IMPRESSION: 1. Stable brain MRI.  No acute intracranial abnormality. 2. Atrophy with mild chronic small vessel ischemic disease, with a few scattered remote lacunar infarcts about the thalami and left cerebellum. Electronically Signed   By: Rise Mu M.D.   On: 02/12/2023 19:45   CT CHEST WO CONTRAST  Result Date: 02/12/2023 CLINICAL DATA:  Pulmonary nodules EXAM: CT CHEST WITHOUT CONTRAST TECHNIQUE: Multidetector CT imaging of the chest was performed following the standard protocol without IV contrast. RADIATION DOSE REDUCTION: This exam was performed according to the departmental dose-optimization program which includes automated exposure control, adjustment of the mA and/or kV according to patient size and/or use of  iterative reconstruction technique. COMPARISON:  01/05/2021 FINDINGS: Cardiovascular: Coronary, aortic arch, and branch vessel atherosclerotic vascular disease. There is a focus of mitral valve calcification. Mediastinum/Nodes: Small type 1 hiatal hernia. Lungs/Pleura: Biapical pleuroparenchymal scarring. Cylindrical bronchiectasis with some airway plugging in the right middle lobe. Small pulmonary nodules are present. This includes a 5 by 4 mm right lower lobe pulmonary nodule on image 140 series 4 unchanged from 12/15/2009, and hence definitively benign. There is also a 5 by 3 by 2 mm (volume = 20 mm^3) left lower lobe pulmonary nodule on image 114 series 4 which is unchanged from 01/05/2021, compatible with benign etiology. No new or enlarging nodule identified. Upper Abdomen: Abdominal aortic atherosclerosis. Musculoskeletal: Mild  thoracic spondylosis. IMPRESSION: 1. Small pulmonary nodules are unchanged and considered benign. These nodules do not require specific follow up. However, this does not necessarily mean that further screening screening assessments should be discontinued. 2. Cylindrical bronchiectasis with some airway plugging in the right middle lobe. 3. Small type 1 hiatal hernia. 4. Coronary, aortic arch, and branch vessel atherosclerotic vascular disease. Calcification of the mitral valve. 5. Aortic atherosclerosis. Aortic Atherosclerosis (ICD10-I70.0). Electronically Signed   By: Gaylyn Rong M.D.   On: 02/12/2023 18:53           LOS: 1 day   Time spent= 35 mins    Vartan Kerins Joline Maxcy, MD [facility]  If 7PM-7AM, please contact night-coverage  02/14/2023, 7:45 AM

## 2023-02-14 NOTE — TOC Initial Note (Signed)
Transition of Care [facility]) - Initial/Assessment Note    Patient Details  Name: Felicia Dunn MRN: [id number] Date of Birth: 1956-05-12  [facility]) CM/SW Contact:    Allena Katz, LCSW Phone Number: 02/14/2023, 10:47 AM  Clinical Narrative:       CSW attempted to speak with patient about outpatient recommendation but pt gone for spinal tap. CSW spoke with husband who reports pt does have a walker at home and states she will not need this but wants me to ask her about the outpatient therapy as he is unsure. CSW will follow back up once pt is back.                   Patient Goals and CMS Choice            Expected Discharge Plan and Services                                              Prior Living Arrangements/Services                       Activities of Daily Living Home Assistive Devices/Equipment: Eyeglasses ADL Screening (condition at time of admission) Patient's cognitive ability adequate to safely complete daily activities?: Yes Is the patient deaf or have difficulty hearing?: No Does the patient have difficulty seeing, even when wearing glasses/contacts?: No Does the patient have difficulty concentrating, remembering, or making decisions?: No Patient able to express need for assistance with ADLs?: Yes Does the patient have difficulty dressing or bathing?: No Independently performs ADLs?: Yes (appropriate for developmental age) Does the patient have difficulty walking or climbing stairs?: No Weakness of Legs: None Weakness of Arms/Hands: None  Permission Sought/Granted                  Emotional Assessment              Admission diagnosis:  Hemichorea [G25.5] Abnormal movement [R25.9] Patient Active Problem List   Diagnosis Date Noted   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   Hyperlipidemia 02/12/2023   Complex tear of lateral meniscus of right knee as current injury 02/08/2021   Primary osteoarthritis of right knee  02/08/2021   Chronic bilateral low back pain without sciatica 12/21/2020   PCP:  Mick Sell, MD Pharmacy:   [facility] [address] [place] - [address] [place] [address] Phone: [phone] Fax: [phone]     Social Determinants of Health (SDOH) Social History: SDOH Screenings   Food Insecurity: No Food Insecurity (02/13/2023)  Housing: Low Risk  (02/13/2023)  Transportation Needs: No Transportation Needs (02/13/2023)  Utilities: Not At Risk (02/13/2023)  Tobacco Use: Medium Risk (02/13/2023)   SDOH Interventions:     Readmission Risk Interventions     No data to display

## 2023-02-14 NOTE — Progress Notes (Signed)
PHARMACY CONSULT NOTE   Pharmacy Consult for Electrolyte Monitoring and Replacement   Recent Labs: Potassium (mmol/L)  Date Value  02/14/2023 3.9   Magnesium (mg/dL)  Date Value  40/98/1191 2.3   Calcium (mg/dL)  Date Value  47/82/9562 8.3 (L)   Albumin (g/dL)  Date Value  13/05/6577 3.8   Sodium (mmol/L)  Date Value  02/14/2023 139     Assessment: 66 year old female admitted with hemichorea. PMH includes stress incontinence, arthritis, vertigo.   Goal of Therapy:  Electrolytes within normal limits  Plan:  No replacement warranted today Follow up BMP and Mag tomorrow AM    Elliot Gurney, PharmD, BCPS Clinical Pharmacist  02/14/2023 7:27 AM

## 2023-02-14 NOTE — Progress Notes (Signed)
OT Cancellation Note  Patient Details Name: Felicia Dunn MRN: [id number] DOB: 08-06-1956   Cancelled Treatment:    Reason Eval/Treat Not Completed: Patient not medically ready. Upon attempt earlier, pt s/p lumbar puncture with orders for bed rest and to remain flat at this time. Will hold OT today and re-attempt next date once cleared for activity.   Arman Filter., MPH, MS, OTR/L ascom [phone] 02/14/23, 3:27 PM

## 2023-02-14 NOTE — Procedures (Signed)
Technically successful L3/L4 lumbar puncture yielding 13 ml of clear CSF for laboratory studies. Please see full dictation under the imaging tab in Epic.  Alwyn Ren, [place] [phone] 02/14/2023, 11:33 AM

## 2023-02-14 NOTE — Evaluation (Signed)
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 Patient DC home from ED after negative workup, plan to FU wth outpatient neurology, but pt returned to ED next day due to progressive symptoms. Repeat MRI again remains unremarkable for acute pathology. At baseline pt is a community dwelling adult, fully independent, no prior falls history or imbalance, no use of AD prior to arrival.  Clinical Impression  Pt in bed on arrival, agreeable to evaluation- husband at bedside. Pt has not had any anxiolytics since yesterday, cognition is reported to be near baseline. Pt has AMB to [facility] a few times since last visit, pt continues to endorse uninvolved gait or balance issues- assessment shows otherwise with mild to moderate gait ataxia, frequent LOB, and repeated pausing in hallway which pt cannot explain. All of this brings to question her mentation and awareness of limitations at present. Husband is asked to come observe gait for safety education. He also watches as pt trials use of RW for AMB, an immediate improvement in ataxia and postural control that is seen by patient, husband, and Chartered loss adjuster. Pt is not very keen on the idea of using a RW- author explained greatest utility in use for longer distance walking in open spaces, mostly any walking outside of the home. Pt is nauseated in session, likely 2/2 NPO status. Additional workup pending today (LP). Will continue to follow.    Recommendations for follow up therapy are one component of a multi-disciplinary discharge planning process, led by the attending physician.  Recommendations may be updated based on patient status, additional functional criteria and insurance authorization.  Follow Up Recommendations        Assistance Recommended at Discharge Set up Supervision/Assistance  Patient can return home with the following  A little help with walking and/or transfers;Assist for transportation    Equipment Recommendations Rolling walker (2 wheels)  Recommendations for Other Services       Functional Status Assessment Patient has had a recent decline in their functional status and demonstrates the ability to make significant improvements in function in a reasonable and predictable amount of time.     Precautions / Restrictions        Mobility  Bed Mobility Overal bed mobility: Modified Independent Bed Mobility: Supine to Sit     Supine to sit: Modified independent (Device/Increase time)          Transfers Overall transfer level: Needs assistance Equipment used: None Transfers: Sit to/from Stand Sit to Stand: Supervision                Ambulation/Gait Ambulation/Gait assistance: Min guard, Supervision Gait Distance (Feet): 590 Feet Assistive device: Rolling walker (2 wheels), None Gait Pattern/deviations: Staggering right, Staggering left, Ataxic, Wide base of support, Drifts right/left       General Gait Details: slow, stops periodically q 50ft without warning/explanation; the start/stop cause more cases of frank LOB; minA needed 1x for recovery; appears more controlled with RW use  Stairs            Wheelchair Mobility    Modified Rankin (Stroke Patients Only)       Balance  Pertinent Vitals/Pain Pain Assessment Pain Assessment: No/denies pain    Home Living Family/patient expects to be discharged to:: Private residence Living Arrangements: Spouse/significant other Available Help at Discharge: Family;Available 24 hours/day Type of Home: House Home Access: Stairs to enter;Ramped entrance   Entrance Stairs-Number of Steps: front 4 steps with R/L rails, back 4 steps with bilateral  rails and can reach both; also has aluminum ramp they can set up of needed   Home Layout: One level Home Equipment: Agricultural consultant (2 wheels);Rollator (4 wheels);Cane - single point;BSC/3in1;Shower seat;Grab bars - tub/shower;Wheelchair - manual      Prior Function Prior Level of Function : Independent/Modified Independent                     Hand Dominance   Dominant Hand: Right    Extremity/Trunk Assessment                Communication   Communication: No difficulties  Cognition Arousal/Alertness: Awake/alert Behavior During Therapy: WFL for tasks assessed/performed Overall Cognitive Status: Within Functional Limits for tasks assessed                                 General Comments: Easily distracted requiring cues for continuous gait; pt denies feeling altered at this time.        General Comments      Exercises     Assessment/Plan    PT Assessment Patient needs continued PT services  PT Problem List Decreased balance;Decreased mobility;Decreased cognition;Decreased safety awareness;Decreased knowledge of use of DME       PT Treatment Interventions DME instruction;Gait training;Stair training;Functional mobility training;Therapeutic activities;Therapeutic exercise;Balance training;Patient/family education;Cognitive remediation;Neuromuscular re-education    PT Goals (Current goals can be found in the Care Plan section)  Acute Rehab PT Goals Patient Stated Goal: restored baseline gait PT Goal Formulation: With patient Time For Goal Achievement: 02/28/23 Potential to Achieve Goals: Poor    Frequency Min 4X/week     Co-evaluation               AM-PAC PT "6 Clicks" Mobility  Outcome Measure Help needed turning from your back to your side while in a flat bed without using bedrails?: None Help needed moving from lying on your back to sitting on the side of a flat bed without using bedrails?: None Help needed moving to and from  a bed to a chair (including a wheelchair)?: None Help needed standing up from a chair using your arms (e.g., wheelchair or bedside chair)?: None Help needed to walk in hospital room?: A Little Help needed climbing 3-5 steps with a railing? : A Little 6 Click Score: 22    End of Session Equipment Utilized During Treatment: Gait belt Activity Tolerance: Patient tolerated treatment well;No increased pain Patient left: in bed;with family/visitor present;with call bell/phone within reach Nurse Communication: Mobility status PT Visit Diagnosis: Unsteadiness on feet (R26.81);Other abnormalities of gait and mobility (R26.89)    Time: 4098-1191 PT Time Calculation (min) (ACUTE ONLY): 17 min   Charges:   PT Evaluation $PT Eval Moderate Complexity: 1 Mod PT Treatments $Therapeutic Activity: 8-22 mins       9:32 AM, 02/14/23 Rosamaria Lints, PT, DPT Physical Therapist - [facility]  [phone] (ASCOM)    Jaylenn Altier C 02/14/2023, 9:27 AM

## 2023-02-14 NOTE — Progress Notes (Signed)
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 Per husband it continues to be absent while she is sleeping.   Objective: Current vital signs: BP 130/71 (BP Location: Left Arm)   Pulse 72   Temp 97.6 F (36.4 C)   Resp 16   Ht 6\' 5"  (1.956 m)   Wt 99.8 kg   SpO2 94%   BMI 26.09 kg/m  Vital signs in last 24 hours: Temp:  [97.5 F (36.4 C)-98.2 F (36.8 C)] 97.6 F (36.4 C) (04/30 0826) Pulse Rate:  [65-92] 72 (04/30 0826) Resp:  [16-22] 16 (04/30 0826) BP: (113-140)/(61-105) 130/71 (04/30 0826) SpO2:  [91 %-97 %] 94 % (04/30 0826)  Intake/Output from previous day: 04/29 0701 - 04/30 0700 In: 780 [I.V.:730; IV Piggyback:50] Out: -  Intake/Output this shift: No intake/output data recorded. Nutritional status:  Diet Order             Diet NPO time specified  Diet effective midnight                   Physical Exam HEENT- [place]/AT   Lungs- Respirations unlabored Extremities- No cyanosis or pallor.    Neurological Examination Mental Status: Awake and alert. Fully oriented. Thought content appropriate.  Speech fluent without evidence of aphasia.  Able to follow all commands without difficulty. Cranial Nerves: II, III,IV, VI: No ptosis. EOMI. No nystagmus. Fixates and tracks normally.  VII: Smile symmetric. Intermittent choreiform movements of right side of face are noted.  VIII: Hearing intact to voice IX,X: No hypophonia or hoarseness XI: Symmetric XII: No lingual dysarthria.  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 She was not able to voluntarily suppress the movement on today's exam.   Cerebellar: No ataxia disproportionate to her chorea on the right.  Gait: Deferred  Lab Results: Results for orders placed or performed during the hospital encounter of 02/12/23 (from the past 48  hour(s))  Basic metabolic panel     Status: Abnormal   Collection Time: 02/12/23  3:03 PM  Result Value Ref Range   Sodium 137 135 - 145 mmol/L   Potassium 3.7 3.5 - 5.1 mmol/L   Chloride 105 98 - 111 mmol/L   CO2 26 22 - 32 mmol/L   Glucose, Bld 96 70 - 99 mg/dL    Comment: Glucose reference range applies only to samples taken after fasting for at least 8 hours.   BUN 9 8 - 23 mg/dL   Creatinine, Ser 1.32 (H) 0.44 - 1.00 mg/dL   Calcium 9.0 8.9 - 44.0 mg/dL   GFR, Estimated 51 (L) >60 mL/min    Comment: (NOTE) Calculated using the CKD-EPI Creatinine Equation (2021)    Anion gap 6 5 - 15    Comment: Performed at [facility], [address] [place], [place] [address]  CBC     Status: None   Collection Time: 02/12/23  3:03 PM  Result Value Ref Range   WBC 7.0 4.0 - 10.5 K/uL   RBC 4.54 3.87 - 5.11 MIL/uL   Hemoglobin 13.5 12.0 - 15.0 g/dL   HCT 53.6 64.4 - 03.4 %   MCV 89.9 80.0 - 100.0 fL   MCH 29.7 26.0 - 34.0 pg   MCHC 33.1 30.0 - 36.0 g/dL   RDW 74.2 59.5 - 63.8 %   Platelets 228 150 - 400 K/uL  nRBC 0.0 0.0 - 0.2 %    Comment: Performed at [facility], [address] [place], [place] [address]  Magnesium     Status: Abnormal   Collection Time: 02/12/23  3:03 PM  Result Value Ref Range   Magnesium 1.6 (L) 1.7 - 2.4 mg/dL    Comment: Performed at [facility], [address] [place], [place] [address]  TSH     Status: None   Collection Time: 02/12/23  3:03 PM  Result Value Ref Range   TSH 1.808 0.350 - 4.500 uIU/mL    Comment: Performed by a 3rd Generation assay with a functional sensitivity of <=0.01 uIU/mL. Performed at [facility], [address] [place], [place] [address]   Hepatic function panel     Status: Abnormal   Collection Time: 02/12/23  3:03 PM  Result Value Ref Range   Total Protein 6.4 (L) 6.5 - 8.1 g/dL   Albumin 3.8 3.5 - 5.0 g/dL   AST 21 15 - 41 U/L   ALT 10 0 - 44 U/L   Alkaline Phosphatase 67 38  - 126 U/L   Total Bilirubin 1.0 0.3 - 1.2 mg/dL   Bilirubin, Direct 0.2 0.0 - 0.2 mg/dL   Indirect Bilirubin 0.8 0.3 - 0.9 mg/dL    Comment: Performed at [facility], [address] [place], [place] [address]  Comprehensive metabolic panel     Status: Abnormal   Collection Time: 02/13/23  5:40 AM  Result Value Ref Range   Sodium 138 135 - 145 mmol/L   Potassium 3.4 (L) 3.5 - 5.1 mmol/L   Chloride 108 98 - 111 mmol/L   CO2 23 22 - 32 mmol/L   Glucose, Bld 104 (H) 70 - 99 mg/dL    Comment: Glucose reference range applies only to samples taken after fasting for at least 8 hours.   BUN 9 8 - 23 mg/dL   Creatinine, Ser 2.95 (H) 0.44 - 1.00 mg/dL   Calcium 8.9 8.9 - 28.4 mg/dL   Total Protein 6.5 6.5 - 8.1 g/dL   Albumin 3.8 3.5 - 5.0 g/dL   AST 20 15 - 41 U/L   ALT 11 0 - 44 U/L   Alkaline Phosphatase 65 38 - 126 U/L   Total Bilirubin 1.1 0.3 - 1.2 mg/dL   GFR, Estimated 58 (L) >60 mL/min    Comment: (NOTE) Calculated using the CKD-EPI Creatinine Equation (2021)    Anion gap 7 5 - 15    Comment: Performed at [facility], [address], [place], [place] [address]  Magnesium     Status: None   Collection Time: 02/13/23  5:40 AM  Result Value Ref Range   Magnesium 2.4 1.7 - 2.4 mg/dL    Comment: Performed at [facility], [address] [place], [place] [address]  Vitamin B12     Status: Abnormal   Collection Time: 02/13/23 10:30 AM  Result Value Ref Range   Vitamin B-12 146 (L) 180 - 914 pg/mL    Comment: (NOTE) This assay is not validated for testing neonatal or myeloproliferative syndrome specimens for Vitamin B12 levels. Performed at [facility], [address], [place], [place] [address]   ANA Comprehensive Panel     Status: Abnormal   Collection Time: 02/13/23 10:30 AM  Result Value Ref Range   ds DNA Ab <1 0 - 9 IU/mL    Comment: (NOTE)  Negative      <5                                    Equivocal  5 - 9                                   Positive      >9    Ribonucleic Protein 1.5 (H) 0.0 - 0.9 AI   ENA SM Ab Ser-aCnc <0.2 0.0 - 0.9 AI   Scleroderma (Scl-70) (ENA) Antibody, IgG <0.2 0.0 - 0.9 AI   SSA (Ro) (ENA) Antibody, IgG <0.2 0.0 - 0.9 AI   SSB (La) (ENA) Antibody, IgG <0.2 0.0 - 0.9 AI   Chromatin Ab SerPl-aCnc <0.2 0.0 - 0.9 AI   Anti JO-1 <0.2 0.0 - 0.9 AI   Centromere Ab Screen <0.2 0.0 - 0.9 AI   See below: Comment     Comment: (NOTE) Autoantibody                       Disease Association ------------------------------------------------------------                        Condition                  Frequency ---------------------   ------------------------   --------- Antinuclear Antibody,    SLE, mixed connective Direct (ANA-D)           tissue diseases ---------------------   ------------------------   --------- dsDNA                    SLE                        40 - 60% ---------------------   ------------------------   --------- Chromatin                Drug induced SLE                90%                         SLE                        48 - 97% ---------------------   ------------------------   --------- SSA (Ro)                 SLE                        25 - 35%                         Sjogren's Syndrome         40 - 70%                         Neonatal Lupus                 100% ---------------------   ------------------------   --------- SSB (La)                 SLE  10%                         Sjogren's Syndrome              30% ---------------------   -----------------------    --------- Sm (anti-Smith)          SLE                        15 - 30% ---------------------   -----------------------    --------- RNP                      Mixed Connective Tissue                         Disease                         95% (U1 nRNP,                SLE                        30 - 50% anti-ribonucleoprotein)  Polymyositis  and/or                         Dermatomyositis                 20% ---------------------   ------------------------   --------- Scl-70 (antiDNA          Scleroderma (diffuse)      20 - 35% topoisomerase)           Crest                           13% ---------------------   ------------------------   --------- Jo-1                     Polymyositis and/or                         Dermatomyositis            20 - 40% ---------------------   ------------------------   --------- Centromere B             Scleroderma -  Crest                         variant                         80% Performed At: [facility] [employer] [address] [place], [place] [id number] Jolene Schimke MD WU:[id number]   Ceruloplasmin     Status: None   Collection Time: 02/13/23 10:30 AM  Result Value Ref Range   Ceruloplasmin 24.0 19.0 - 39.0 mg/dL    Comment: (NOTE) Performed At: [facility] [address] [place], [place] [id number] Jolene Schimke MD VH:[id number]   VITAMIN D 25 Hydroxy (Vit-D Deficiency, Fractures)     Status: Abnormal   Collection Time: 02/13/23 10:30 AM  Result Value Ref Range   Vit D, 25-Hydroxy 10.26 (L) 30 - 100 ng/mL    Comment: (NOTE) Vitamin D deficiency has been defined by the Institute of Medicine  and an Endocrine Society practice guideline as a level  of serum 25-OH  vitamin D less than 20 ng/mL (1,2). The Endocrine Society went on to  further define vitamin D insufficiency as a level between 21 and 29  ng/mL (2).  1. IOM (Institute of Medicine). 2010. Dietary reference intakes for  calcium and D. [place]: [employer]. 2. Holick MF, Binkley Edgar, Bischoff-Ferrari HA, et al. Evaluation,  treatment, and prevention of vitamin D deficiency: an Endocrine  Society clinical practice guideline, JCEM. 2011 Jul; 96(7): 1911-30.  Performed at [facility], [address], [place], [place] [address]   TSH     Status: None   Collection Time: 02/13/23  10:30 AM  Result Value Ref Range   TSH 1.690 0.350 - 4.500 uIU/mL    Comment: Performed by a 3rd Generation assay with a functional sensitivity of <=0.01 uIU/mL. Performed at [facility], [address] [place], [place] [address]   Rapid HIV screen (HIV 1/2 Ab+Ag)     Status: None   Collection Time: 02/13/23 10:30 AM  Result Value Ref Range   HIV-1 P24 Antigen - HIV24 NON REACTIVE NON REACTIVE    Comment: (NOTE) Detection of p24 may be inhibited by biotin in the sample, causing false negative results in acute infection.    HIV 1/2 Antibodies NON REACTIVE NON REACTIVE   Interpretation (HIV Ag Ab)      A non reactive test result means that HIV 1 or HIV 2 antibodies and HIV 1 p24 antigen were not detected in the specimen.    Comment: Performed at [facility], [address] [place], [place] [address]  Basic metabolic panel     Status: Abnormal   Collection Time: 02/14/23  6:21 AM  Result Value Ref Range   Sodium 139 135 - 145 mmol/L   Potassium 3.9 3.5 - 5.1 mmol/L   Chloride 109 98 - 111 mmol/L   CO2 26 22 - 32 mmol/L   Glucose, Bld 89 70 - 99 mg/dL    Comment: Glucose reference range applies only to samples taken after fasting for at least 8 hours.   BUN 8 8 - 23 mg/dL   Creatinine, Ser 5.78 0.44 - 1.00 mg/dL   Calcium 8.3 (L) 8.9 - 10.3 mg/dL   GFR, Estimated >46 >96 mL/min    Comment: (NOTE) Calculated using the CKD-EPI Creatinine Equation (2021)    Anion gap 4 (L) 5 - 15    Comment: Performed at [facility], [address] [place], [place] [address]  CBC     Status: None   Collection Time: 02/14/23  6:21 AM  Result Value Ref Range   WBC 5.7 4.0 - 10.5 K/uL   RBC 4.31 3.87 - 5.11 MIL/uL   Hemoglobin 13.1 12.0 - 15.0 g/dL   HCT 41.3 24.4 - 01.0 %   MCV 89.1 80.0 - 100.0 fL   MCH 30.4 26.0 - 34.0 pg   MCHC 34.1 30.0 - 36.0 g/dL   RDW 27.2 53.6 - 64.4 %   Platelets 190 150 - 400 K/uL   nRBC 0.0 0.0 - 0.2 %    Comment: Performed at  [facility], [address] [place], [place] [address]  Magnesium     Status: None   Collection Time: 02/14/23  6:21 AM  Result Value Ref Range   Magnesium 2.3 1.7 - 2.4 mg/dL    Comment: Performed at [facility], [address] [place], [place] [address]    No results  found for this or any previous visit (from the past 240 hour(s)).  Lipid Panel No results for input(s): "CHOL", "TRIG", "HDL", "CHOLHDL", "VLDL", "LDLCALC" in the last 72 hours.  Studies/Results: MR Brain W and Wo Contrast  Result Date: 02/12/2023 CLINICAL DATA:  Initial evaluation for headache. Neuro deficit, hemi chorea. EXAM: MRI HEAD WITHOUT AND WITH CONTRAST TECHNIQUE: Multiplanar, multiecho pulse sequences of the brain and surrounding structures were obtained without and with intravenous contrast. CONTRAST:  10mL GADAVIST GADOBUTROL 1 MMOL/ML IV SOLN, 10mL GADAVIST GADOBUTROL 1 MMOL/ML IV SOLN COMPARISON:  Brain MRI from 1 day earlier on 02/11/2023. FINDINGS: Brain: Atrophy with mild chronic small vessel ischemic disease. Few scatter remote lacunar infarcts about the thalami and left cerebellum again noted. No evidence for acute or subacute ischemia. No areas of chronic cortical infarction. No acute or chronic intracranial blood products. No mass lesion, midline shift or mass effect. Mild ventricular prominence related to global parenchymal volume loss of hydrocephalus. No parenchymal changes of hemichorea/hemiballismus. Pituitary gland and suprasellar region within normal limits. No abnormal enhancement. Vascular: Major intracranial vascular flow voids are maintained. Skull and upper cervical spine: Craniocervical junction within normal limits. Bone marrow signal intensity normal. No scalp soft tissue abnormality. Sinuses/Orbits: Prior ocular lens replacement on the left. Paranasal sinuses are clear. No mastoid effusion. Other: None. IMPRESSION: 1. Stable brain MRI.  No acute intracranial abnormality. 2.  Atrophy with mild chronic small vessel ischemic disease, with a few scattered remote lacunar infarcts about the thalami and left cerebellum. Electronically Signed   By: Rise Mu M.D.   On: 02/12/2023 19:45   CT CHEST WO CONTRAST  Result Date: 02/12/2023 CLINICAL DATA:  Pulmonary nodules EXAM: CT CHEST WITHOUT CONTRAST TECHNIQUE: Multidetector CT imaging of the chest was performed following the standard protocol without IV contrast. RADIATION DOSE REDUCTION: This exam was performed according to the departmental dose-optimization program which includes automated exposure control, adjustment of the mA and/or kV according to patient size and/or use of iterative reconstruction technique. COMPARISON:  01/05/2021 FINDINGS: Cardiovascular: Coronary, aortic arch, and branch vessel atherosclerotic vascular disease. There is a focus of mitral valve calcification. Mediastinum/Nodes: Small type 1 hiatal hernia. Lungs/Pleura: Biapical pleuroparenchymal scarring. Cylindrical bronchiectasis with some airway plugging in the right middle lobe. Small pulmonary nodules are present. This includes a 5 by 4 mm right lower lobe pulmonary nodule on image 140 series 4 unchanged from 12/15/2009, and hence definitively benign. There is also a 5 by 3 by 2 mm (volume = 20 mm^3) left lower lobe pulmonary nodule on image 114 series 4 which is unchanged from 01/05/2021, compatible with benign etiology. No new or enlarging nodule identified. Upper Abdomen: Abdominal aortic atherosclerosis. Musculoskeletal: Mild thoracic spondylosis. IMPRESSION: 1. Small pulmonary nodules are unchanged and considered benign. These nodules do not require specific follow up. However, this does not necessarily mean that further screening screening assessments should be discontinued. 2. Cylindrical bronchiectasis with some airway plugging in the right middle lobe. 3. Small type 1 hiatal hernia. 4. Coronary, aortic arch, and branch vessel atherosclerotic  vascular disease. Calcification of the mitral valve. 5. Aortic atherosclerosis. Aortic Atherosclerosis (ICD10-I70.0). Electronically Signed   By: Gaylyn Rong M.D.   On: 02/12/2023 18:53    Medications: Scheduled:  vitamin B-12  2,000 mcg Oral Daily   Assessment: 67 yo female with a PMHx of vertigo who presents to ED with onset of right hemichorea that began one week ago and has been worsening since that time. She did not present with  hyperglycemia and she has no family history of movement disorders. - Exam today is essentially unchanged from yesterday's exam performed by Dr. Selina Cooley. 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  - Labs:  - UDS negative - HIV negative - Ribonucleic protein elevated at 1.5. Lupus panel otherwise negative. Significance of this finding is uncertain given that the remainder of the panel is negative.  - Vitamin D came back low at 10.26 ng/mL (moderate deficiency bordering on severe based on published cutoffs). The literature documents rare cases of hyperkinetic movement disorders including hemichorea, in association with vitamin D deficiency. Will need supplementation.  - Vitamin B12 came back low at 146. The literature also documents a rare association between B12 deficiency and the development of chorea. Supplemental high-dose B12 at 2000 mcg po per day has been started.  - Ceroloplasmin level is normal.  - MRI brain with and without contrast: No acute intracranial abnormality. Atrophy with mild chronic small vessel ischemic disease and a few scattered remote lacunar infarcts about the thalami and left cerebellum. No acute abnormality.  - Fluoro-guided LP performed today (4/30): Glucose, WBC, protein normal. Clear and colorless CSF.  - DDx: - We suspect culprit may be the transvaginal estrogen she began one month ago. There are cases in the literature of postmenopausal women developing chorea after starting HRT.  - As noted above, vitamin D and B12  deficiencies are rare causes of chorea. Supplementation has been started.  - DDx also includes autoimmune or inflammatory processes with SLE now being lower on the DDx given only one component of the panel being positive. Sjogrens syndrome essentially ruled out due to negative anti-Ro, anti-La and antinuclear antibody levels. Other inflammatory etiologies that are still significant components of the DDx include antiphospholipid syndrome and paraneoplastic syndrome.  - Wilson's disease essentially ruled out given her age and normal ceruloplasmin level    Recommendations:  - Transvaginal estrogen has been stopped. Continue off this medication and other estrogen compounds indefinitely.  - Ordered labs: - Antiphospholipid panel pending - Serum copper pending - IgG index, oligoclonal bands and CSF VZV PCR are pending.  - Serum paraneoplastic panel is pending.  - Continue vitamin B12 supplementation indefinitely.  - Starting loading dose regimen for vitamin D: 50,000 IU of vitamin D orally once weekly for 3 months. After completing loading dose regimen, continue with daily vitamin D supplementation at 2000 IU/day thereafter (ordered).     LOS: 1 day   @Electronically  signed: Dr. Caryl Pina 02/14/2023  10:54 AM

## 2023-02-15 ENCOUNTER — Inpatient Hospital Stay (HOSPITAL_COMMUNITY)
Admit: 2023-02-15 | Discharge: 2023-02-15 | Disposition: A | Discharge status: Home / Self Care | Payer: Medicare HMO | Attending: Internal Medicine | Admitting: Internal Medicine

## 2023-02-15 DIAGNOSIS — E538 Deficiency of other specified B group vitamins: Secondary | ICD-10-CM | POA: Insufficient documentation

## 2023-02-15 DIAGNOSIS — I420 Dilated cardiomyopathy: Secondary | ICD-10-CM

## 2023-02-15 DIAGNOSIS — I951 Orthostatic hypotension: Secondary | ICD-10-CM | POA: Insufficient documentation

## 2023-02-15 DIAGNOSIS — R259 Unspecified abnormal involuntary movements: Secondary | ICD-10-CM | POA: Diagnosis not present

## 2023-02-15 DIAGNOSIS — E876 Hypokalemia: Secondary | ICD-10-CM

## 2023-02-15 DIAGNOSIS — N182 Chronic kidney disease, stage 2 (mild): Secondary | ICD-10-CM | POA: Insufficient documentation

## 2023-02-15 DIAGNOSIS — G255 Other chorea: Secondary | ICD-10-CM | POA: Diagnosis not present

## 2023-02-15 DIAGNOSIS — R911 Solitary pulmonary nodule: Secondary | ICD-10-CM | POA: Insufficient documentation

## 2023-02-15 DIAGNOSIS — E559 Vitamin D deficiency, unspecified: Secondary | ICD-10-CM | POA: Insufficient documentation

## 2023-02-15 LAB — BASIC METABOLIC PANEL
Anion gap: 4 — ABNORMAL LOW (ref 5–15)
BUN: 12 mg/dL (ref 8–23)
CO2: 24 mmol/L (ref 22–32)
Calcium: 8.7 mg/dL — ABNORMAL LOW (ref 8.9–10.3)
Chloride: 108 mmol/L (ref 98–111)
Creatinine, Ser: 1.01 mg/dL — ABNORMAL HIGH (ref 0.44–1.00)
GFR, Estimated: >60 mL/min (ref >60)
Glucose, Bld: 92 mg/dL (ref 70–99)
Potassium: 3.7 mmol/L (ref 3.5–5.1)
Sodium: 136 mmol/L (ref 135–145)

## 2023-02-15 LAB — ECHOCARDIOGRAM COMPLETE
AR max vel: 2.65 cm2
AV Area VTI: 3.02 cm2
AV Area mean vel: 2.72 cm2
AV Mean grad: 2 mmHg
AV Peak grad: 4.7 mmHg
Ao pk vel: 1.08 m/s
Area-P 1/2: 2.73 cm2
Height: 77 in
MV VTI: 2.4 cm2
S' Lateral: 3.2 cm
Weight: 3520.31 oz

## 2023-02-15 LAB — C-REACTIVE PROTEIN: CRP: 0.8 mg/dL (ref <1.0)

## 2023-02-15 LAB — MAGNESIUM: Magnesium: 2.1 mg/dL (ref 1.7–2.4)

## 2023-02-15 LAB — CBC
HCT: 38.4 % (ref 36.0–46.0)
Hemoglobin: 12.9 g/dL (ref 12.0–15.0)
MCH: 29.7 pg (ref 26.0–34.0)
MCHC: 33.6 g/dL (ref 30.0–36.0)
MCV: 88.5 fL (ref 80.0–100.0)
Platelets: 181 10*3/uL (ref 150–400)
RBC: 4.34 MIL/uL (ref 3.87–5.11)
RDW: 12.1 % (ref 11.5–15.5)
WBC: 5.5 10*3/uL (ref 4.0–10.5)
nRBC: 0 % (ref 0.0–0.2)

## 2023-02-15 LAB — ANTIPHOSPHOLIPID SYNDROME PROF
Anticardiolipin IgG: <9 GPL U/mL (ref 0–14)
Anticardiolipin IgM: <9 MPL U/mL (ref 0–12)
DRVVT: 29.1 s (ref 0.0–47.0)
PTT Lupus Anticoagulant: 33.1 s (ref 0.0–43.5)

## 2023-02-15 LAB — TSH: TSH: 1.229 u[IU]/mL (ref 0.350–4.500)

## 2023-02-15 LAB — COPPER, SERUM: Copper: 78 ug/dL — ABNORMAL LOW (ref 80–158)

## 2023-02-15 LAB — TROPONIN I (HIGH SENSITIVITY): Troponin I (High Sensitivity): 13 ng/L (ref <18)

## 2023-02-15 LAB — SEDIMENTATION RATE: Sed Rate: 11 mm/hr (ref 0–30)

## 2023-02-15 MED ORDER — ENOXAPARIN SODIUM 40 MG/0.4ML IJ SOSY
40.0000 mg | PREFILLED_SYRINGE | INTRAMUSCULAR | Status: DC
  Administered 2023-02-15 – 2023-02-19 (×5): 40 mg via SUBCUTANEOUS
  Filled 2023-02-15 (×5): qty 0.4

## 2023-02-15 NOTE — Progress Notes (Signed)
Physical Therapy Treatment Patient Details Name: Felicia Dunn MRN: [id number] DOB: 02-07-1956 Today's Date: 02/15/2023   History of Present Illness Athena Baltz is a 67yoF, PMH unremarkable, comes to [facility] ED after acute, insidious onset Rt hemibody choreiform movements of arm, leg, and of orofacial complex. Patient DC home from ED after negative workup, plan to FU wth outpatient neurology, but pt returned to ED next day due to progressive symptoms. Repeat MRI again remains unremarkable for acute pathology. At baseline pt is a community dwelling adult, fully independent, no prior falls history or imbalance, no use of AD prior to arrival.    PT Comments    Pt seated at window on entry, agreeable to session. AMB in hallway performed at minGuard assist and without device to facilitate practice with righting responses and continue to demonstrate the extent of safety assist needed for husband. Pt able to do 4 stairs up and down with 2 rails. Pt grows increasingly somnolent, weak in legs, ataxic, and dizzy on latter half of AMB- BP assessment after return to room revealing of orthostatic hypotension. RN and medical team made aware. Husband attends entire session. He again iterates that pt typically AMB faster and without any of these issues at baseline. Will continue to follow.   Orthostatic VS for the past 24 hrs:  BP- Sitting Pulse- Sitting BP- Standing at 0 minutes Pulse- Standing at 0 minutes  02/15/23 1120 128/80 113 112/58 110       Recommendations for follow up therapy are one component of a multi-disciplinary discharge planning process, led by the attending physician.  Recommendations may be updated based on patient status, additional functional criteria and insurance authorization.  Follow Up Recommendations       Assistance Recommended at Discharge Set up Supervision/Assistance  Patient can return home with the following A little help with walking and/or transfers;Assist for  transportation   Equipment Recommendations  Rolling walker (2 wheels)    Recommendations for Other Services       Precautions / Restrictions Precautions Precautions: Fall Restrictions Weight Bearing Restrictions: No     Mobility  Bed Mobility                    Transfers Overall transfer level: Needs assistance Equipment used: None Transfers: Sit to/from Stand Sit to Stand: Supervision                Ambulation/Gait   Gait Distance (Feet): 600 Feet Assistive device: None Gait Pattern/deviations: Staggering right, Staggering left, Ataxic, Drifts right/left, Wide base of support       General Gait Details: getting more drowsy weak and ataxic last 256ft, subsequent screening revealing of orthostatic hypotension   Stairs Stairs: Yes Stairs assistance: Min guard Stair Management: Two rails, Alternating pattern Number of Stairs: 8     Wheelchair Mobility    Modified Rankin (Stroke Patients Only)       Balance                                            Cognition Arousal/Alertness: Awake/alert Behavior During Therapy: WFL for tasks assessed/performed Overall Cognitive Status: Within Functional Limits for tasks assessed                                 General Comments: still somewhat drowsy  at times, particualry after walking        Exercises      General Comments        Pertinent Vitals/Pain Pain Assessment Pain Assessment: No/denies pain    Home Living                          Prior Function            PT Goals (current goals can now be found in the care plan section) Acute Rehab PT Goals Patient Stated Goal: restored baseline gait PT Goal Formulation: With patient Time For Goal Achievement: 02/28/23 Potential to Achieve Goals: Poor Progress towards PT goals: Progressing toward goals    Frequency    Min 4X/week      PT Plan Current plan remains appropriate     Co-evaluation              AM-PAC PT "6 Clicks" Mobility   Outcome Measure  Help needed turning from your back to your side while in a flat bed without using bedrails?: None Help needed moving from lying on your back to sitting on the side of a flat bed without using bedrails?: None Help needed moving to and from a bed to a chair (including a wheelchair)?: A Little Help needed standing up from a chair using your arms (e.g., wheelchair or bedside chair)?: A Little Help needed to walk in hospital room?: A Little Help needed climbing 3-5 steps with a railing? : A Little 6 Click Score: 20    End of Session Equipment Utilized During Treatment: Gait belt Activity Tolerance: Patient tolerated treatment well;No increased pain Patient left: in bed;with family/visitor present;with call bell/phone within reach Nurse Communication: Mobility status PT Visit Diagnosis: Unsteadiness on feet (R26.81);Other abnormalities of gait and mobility (R26.89)     Time: 1610-9604 PT Time Calculation (min) (ACUTE ONLY): 23 min  Charges:  $Therapeutic Exercise: 23-37 mins                    12:15 PM, 02/15/23 Rosamaria Lints, PT, DPT Physical Therapist - [facility]  [phone] (ASCOM)    Tamas Suen C 02/15/2023, 12:05 PM

## 2023-02-15 NOTE — Hospital Course (Signed)
67 year old with history of vertigo comes to the ER day prior to admission with complaints of 1 week of right-sided hemichorea.  Recently started on transvaginal estrogen.  Initially in the ED CT head and MRI brain were negative but did show chronic cerebral atrophy.  Patient was sent home but continued to have symptoms therefore returned back to the ED.  Repeat MRI again remains unremarkable for acute pathology.   Psych component ruled out by psychiatry team.  CSF study showed normal cell count and protein.  Patient appeared to have a vitamin D and B12 deficiency.  Started on supplements. Patient also has normal ceruloplasmin level.

## 2023-02-15 NOTE — Progress Notes (Signed)
Progress Note   Patient: Felicia Dunn JJO:[id number] DOB: 10/11/1956 DOA: 02/12/2023     2 DOS: the patient was seen and examined on 02/15/2023   Brief hospital course: 67 year old with history of vertigo comes to the ER day prior to admission with complaints of 1 week of right-sided hemichorea.  Recently started on transvaginal estrogen.  Initially in the ED CT head and MRI brain were negative but did show chronic cerebral atrophy.  Patient was sent home but continued to have symptoms therefore returned back to the ED.  Repeat MRI again remains unremarkable for acute pathology.   Psych component ruled out by psychiatry team.  CSF study showed normal cell count and protein.  Patient appeared to have a vitamin D and B12 deficiency.  Started on supplements. Patient also has normal ceruloplasmin level.    Principal Problem:   Hemichorea Active Problems:   Hyperlipidemia   Vitamin B12 deficiency   CKD (chronic kidney disease) stage 2, GFR 60-89 ml/min   Assessment and Plan: Hemichorea. Symptoms started suddenly about a week ago, did not have any fever or respiratory symptoms. Workup including  LP appear negative.  MRI brain with and without contrast showed small vascular disease, remote lacunar infract in bilateral thalami and left cerebellar. Patient has vitamin D and vitamin B12 deficiency, but unlikely is a source. Reviewed patient EKG, has a low voltage without ST-T changes.  Patient does has a very distant heart sound.  Echocardiogram has been ordered. Also added ASO, ESR, and CRP.  Additionally, troponin is ordered.  It is possible that the patient had a strep infection, caused her to have chorea and carditis.  Orthostatic hypotension. Which she is identified by physical therapy after walking.  Symptom has been better after lying back in the bed.  Will check echocardiogram.  Check a cortisol level and TSH.  Vitamin B12 deficiency without anemia. Vitamin D deficiency. Oral vitamin D  and B12 will be given.  Hypokalemia. Hypomagnesemia. Resolved.  Lung nodule. Small lung nodule on CT chest, follow-up with PCP as outpatient.    Subjective:  Patient still has significant involuntary movement in the right arm and right leg.  She slept better with benzodiazepine.  She developed orthostasis with dizziness after walking with physical therapy.  No chest pain, short of breath or palpitation.  Physical Exam: Vitals:   02/14/23 1728 02/14/23 2103 02/15/23 0427 02/15/23 0727  BP: 115/68 125/66 138/75 128/69  Pulse: 71 80 66 75  Resp: 18 19 18 20   Temp: 98.3 F (36.8 C) 98.4 F (36.9 C) 97.6 F (36.4 C) 98 F (36.7 C)  TempSrc: Oral Oral Oral   SpO2: 95% 100% 97% 95%  Weight:      Height:       General exam: Appears calm and comfortable  Respiratory system: Clear to auscultation. Respiratory effort normal. Cardiovascular system: S1 & S2 heard, RRR. No JVD, murmurs, rubs, gallops or clicks. No pedal edema. Gastrointestinal system: Abdomen is nondistended, soft and nontender. No organomegaly or masses felt. Normal bowel sounds heard. Central nervous system: Alert and oriented. No focal neurological deficits. Extremities: Symmetric 5 x 5 power. Skin: No rashes, lesions or ulcers Psychiatry: Judgement and insight appear normal. Mood & affect appropriate.    Data Reviewed:  Reviewed EKG, MRI of the brain, CT chest without contrast,, lab results.  Family Communication: Husband updated at the bedside.  Disposition: Status is: Inpatient Remains inpatient appropriate because: Severity of disease, continue workup.     Time spent:  50 minutes  Author: Marrion Coy, MD 02/15/2023 1:07 PM  For on call review [email or web address].

## 2023-02-15 NOTE — Progress Notes (Signed)
OT Cancellation Note  Patient Details Name: VIVICA DOBOSZ MRN: [id number] DOB: [birth year]   Cancelled Treatment:    Reason Eval/Treat Not Completed: Patient declined, no reason specified. Pt received in bed with spouse present. Pt deferred all self-care tasks and mobility at this time. Will re-attempt at later date/time.  Dorene Grebe  [facility] 02/15/2023, 4:17 PM

## 2023-02-15 NOTE — Progress Notes (Signed)
Mobility Specialist - Progress Note   02/15/23 0814  Mobility  Activity Ambulated with assistance in room;Ambulated with assistance to bathroom;Stood at bedside  Level of Assistance Standby assist, set-up cues, supervision of patient - no hands on  Assistive Device None  Distance Ambulated (ft) 10 ft  Activity Response Tolerated well  Mobility Referral Yes  $Mobility charge 1 Mobility   Author responds to bed alarm. Pt OOB ambulating to bathroom Supervision. Pt left in bathroom with husband and education on how to call for assistance.   Terrilyn Saver  Mobility Specialist  02/15/23 8:16 AM

## 2023-02-15 NOTE — TOC Progression Note (Signed)
Transition of Care [facility]) - Progression Note    Patient Details  Name: Felicia Dunn MRN: [id number] Date of Birth: 05/09/1956  Transition of [facility]) CM/SW Contact  Allena Katz, LCSW Phone Number: 02/15/2023, 1:46 PM  Clinical Narrative:   CSW spoke with pt and wife who report they would like to do outpatient therapy at the [facility] outpatient clinic. CSW to send referral.          Expected Discharge Plan and Services                                               Social Determinants of Health (SDOH) Interventions SDOH Screenings   Food Insecurity: No Food Insecurity (02/13/2023)  Housing: Low Risk  (02/13/2023)  Transportation Needs: No Transportation Needs (02/13/2023)  Utilities: Not At Risk (02/13/2023)  Tobacco Use: Medium Risk (02/13/2023)    Readmission Risk Interventions     No data to display

## 2023-02-15 NOTE — Progress Notes (Signed)
*  PRELIMINARY RESULTS* Echocardiogram 2D Echocardiogram has been performed.  Felicia Dunn 02/15/2023, 2:34 PM

## 2023-02-15 NOTE — Progress Notes (Signed)
     [facility] REFERRAL        Occupational Therapy * Physical Therapy * Speech Therapy                           DATE 02/15/2023   PATIENT NAME  Felicia Dunn  PATIENT MRN [id number]        DIAGNOSIS/DIAGNOSIS CODE G25.5   DATE OF DISCHARGE: 5/2       PRIMARY CARE PHYSICIAN      PCP PHONE/FAX  Clydie Braun      Dear Provider (Name: [facility] outpatient __  Fax: [phone]   I certify that I have examined this patient and that occupational/physical/speech therapy is necessary on an outpatient basis.    The patient has expressed interest in completing their recommended course of therapy at your  location.  Once a formal order from the patient's primary care physician has been obtained, please  contact him/her to schedule an appointment for evaluation at your earliest convenience.   [ x]  Physical Therapy Evaluate and Treat  [ x ]  Occupational Therapy Evaluate and Treat  [  ]  Speech Therapy Evaluate and Treat         The patient's primary care physician (listed above) must furnish and be responsible for a formal order such that the recommended services may be furnished while under the primary physician's care, and that the plan of care will be established and reviewed every 30 days (or more often if condition necessitates).

## 2023-02-15 NOTE — Progress Notes (Addendum)
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   Objective: Current vital signs: BP 128/69 (BP Location: Right Wrist)   Pulse 75   Temp 98 F (36.7 C)   Resp 20   Ht 6\' 5"  (1.956 m)   Wt 99.8 kg   SpO2 95%   BMI 26.09 kg/m  Vital signs in last 24 hours: Temp:  [97.6 F (36.4 C)-98.4 F (36.9 C)] 98 F (36.7 C) (05/01 0727) Pulse Rate:  [66-80] 75 (05/01 0727) Resp:  [18-20] 20 (05/01 0727) BP: (115-138)/(66-75) 128/69 (05/01 0727) SpO2:  [95 %-100 %] 95 % (05/01 0727)  Intake/Output from previous day: 04/30 0701 - 05/01 0700 In: 530 [P.O.:530] Out: -  Intake/Output this shift: No intake/output data recorded. Nutritional status:  Diet Order             Diet regular Room service appropriate? Yes; Fluid consistency: Thin  Diet effective now                   Physical Exam HEENT- /AT   Lungs- Respirations unlabored Extremities- No cyanosis or pallor.    Neurological Examination Mental Status: Awake and alert. Fully oriented. Thought content appropriate.  Speech fluent without evidence of aphasia.  Able to follow all commands without difficulty. Cranial Nerves: II, III,IV, VI: No ptosis. EOMI. No nystagmus. Fixates and tracks normally.  VII: Smile symmetric. Intermittent choreiform movements of right side of face are noted, but less prominently than on prior exams.  VIII: Hearing intact to voice IX,X: No hypophonia or hoarseness XI: Symmetric XII: No lingual dysarthria.  Motor: Slightly improved from yesterday's exam, now with less frequent right-sided moderate amplitude hemichorea that is present both with and without intentional movements of her right arm and leg.    Cerebellar: No ataxia disproportionate to her chorea on the right.  Gait: Deferred  Lab Results: Results for orders placed or performed during the hospital encounter of 02/12/23 (from the past 48 hour(s))  Basic metabolic panel     Status: Abnormal   Collection Time: 02/14/23   6:21 AM  Result Value Ref Range   Sodium 139 135 - 145 mmol/L   Potassium 3.9 3.5 - 5.1 mmol/L   Chloride 109 98 - 111 mmol/L   CO2 26 22 - 32 mmol/L   Glucose, Bld 89 70 - 99 mg/dL    Comment: Glucose reference range applies only to samples taken after fasting for at least 8 hours.   BUN 8 8 - 23 mg/dL   Creatinine, Ser 2.95 0.44 - 1.00 mg/dL   Calcium 8.3 (L) 8.9 - 10.3 mg/dL   GFR, Estimated >62 >13 mL/min    Comment: (NOTE) Calculated using the CKD-EPI Creatinine Equation (2021)    Anion gap 4 (L) 5 - 15    Comment: Performed at [facility], [address] [place], [place] [address]  CBC     Status: None   Collection Time: 02/14/23  6:21 AM  Result Value Ref Range   WBC 5.7 4.0 - 10.5 K/uL   RBC 4.31 3.87 - 5.11 MIL/uL   Hemoglobin 13.1 12.0 - 15.0 g/dL   HCT 84.6 96.2 - 95.2 %   MCV 89.1 80.0 - 100.0 fL   MCH 30.4 26.0 - 34.0 pg   MCHC 34.1 30.0 - 36.0 g/dL   RDW 84.1 32.4 - 40.1 %   Platelets 190 150 - 400 K/uL   nRBC 0.0 0.0 - 0.2 %    Comment: Performed at [facility], [address], [place], [place] [address]  Magnesium     Status: None   Collection Time: 02/14/23  6:21 AM  Result Value Ref Range   Magnesium 2.3 1.7 - 2.4 mg/dL    Comment: Performed at [facility], [address] [place], [place] [address]  CSF cell count with differential     Status: Abnormal   Collection Time: 02/14/23 11:10 AM  Result Value Ref Range   Tube # 3    Color, CSF COLORLESS COLORLESS   Appearance, CSF CLEAR CLEAR   Supernatant NOT APPLICABLE    RBC Count, CSF 5 (H) 0 - 3 /cu mm    Comment: CORRECTED ON 04/30 AT 1305: PREVIOUSLY REPORTED AS 0   WBC, CSF 5 0 - 5 /cu mm    Comment: CORRECTED RESULTS CALLED TO: AMANDA PEREZ 1300 02/14/23 MU CORRECTED ON 04/30 AT 1305: PREVIOUSLY REPORTED AS 40 CRITICAL RESULT CALLED TO, READ BACK BY AND VERIFIED WITH: AMANDA PEREZ 1236 02/14/23 MU    Segmented Neutrophils-CSF 6 %   Lymphs, CSF 50 %    Monocyte-Macrophage-Spinal Fluid 44 %   Eosinophils, CSF 0 %    Comment: Performed at [facility], [address] [place], [place] [address]  Protein and glucose, CSF     Status: None   Collection Time: 02/14/23 11:10 AM  Result Value Ref Range   Glucose, CSF 50 40 - 70 mg/dL   Total  Protein, CSF 30 15 - 45 mg/dL    Comment: Performed at [facility], [address] [place], [place] [address]  Basic metabolic panel     Status: Abnormal   Collection Time: 02/15/23  5:05 AM  Result Value Ref Range   Sodium 136 135 - 145 mmol/L   Potassium 3.7 3.5 - 5.1 mmol/L   Chloride 108 98 - 111 mmol/L   CO2 24 22 - 32 mmol/L   Glucose, Bld 92 70 - 99 mg/dL    Comment: Glucose reference range applies only to samples taken after fasting for at least 8 hours.   BUN 12 8 - 23 mg/dL   Creatinine, Ser 9.52 (H) 0.44 - 1.00 mg/dL   Calcium 8.7 (L) 8.9 - 10.3 mg/dL   GFR, Estimated >84 >13 mL/min    Comment: (NOTE) Calculated using the CKD-EPI Creatinine Equation (2021)    Anion gap 4 (L) 5 - 15    Comment: Performed at [facility], [address] [place], [place] [address]  CBC     Status: None   Collection Time: 02/15/23  5:05 AM  Result Value Ref Range   WBC 5.5 4.0 - 10.5 K/uL   RBC 4.34 3.87 - 5.11 MIL/uL   Hemoglobin 12.9 12.0 - 15.0 g/dL   HCT 02.7 25.3 - 66.4 %   MCV 88.5 80.0 - 100.0 fL   MCH 29.7 26.0 - 34.0 pg   MCHC 33.6 30.0 - 36.0 g/dL   RDW 40.3 47.4 - 25.9 %   Platelets 181 150 - 400 K/uL   nRBC 0.0 0.0 - 0.2 %    Comment: Performed at [facility], [address], [place], [place] [address]  Magnesium     Status: None   Collection Time: 02/15/23  5:05 AM  Result Value Ref Range   Magnesium 2.1 1.7 - 2.4 mg/dL    Comment: Performed at [facility], [address] [place], [place] [address]    No results found for this or  any previous visit (from the past 240 hour(s)).  Lipid Panel No results for input(s): "CHOL",  "TRIG", "HDL", "CHOLHDL", "VLDL", "LDLCALC" in the last 72 hours.  Studies/Results: DG FL GUIDED LUMBAR PUNCTURE  Result Date: 02/14/2023 CLINICAL DATA:  Patient with right-sided hemichorea. Diagnostic lumbar puncture requested. EXAM: DIAGNOSTIC LUMBAR PUNCTURE UNDER FLUOROSCOPIC GUIDANCE COMPARISON:  None Available. FLUOROSCOPY: Radiation Exposure Index (as provided by the fluoroscopic device): 7.80 mGy Kerma PROCEDURE: Informed consent was obtained from the patient prior to the procedure, including potential complications of headache, allergy, and pain. With the patient prone, the lower back was prepped with Betadine. 1% Lidocaine was used for local anesthesia. Lumbar puncture was performed at the L3/L4 level using a 20 gauge needle with return of clear CSF with an opening pressure of 23 cm water. 13 ml of CSF were obtained for laboratory studies. The patient tolerated the procedure well and there were no apparent complications. IMPRESSION: Technically successful L3-L4 lumbar puncture yielding 13 mL of clear CSF for laboratory studies. Procedure performed by Alwyn Ren, NP Electronically Signed   By: Simonne Come M.D.   On: 02/14/2023 12:24    Medications: Prior to Admission:  Medications Prior to Admission  Medication Sig Dispense Refill Last Dose   Estradiol 10 MCG TABS vaginal tablet Place 1 tablet vaginally 2 (two) times a week.      meloxicam (MOBIC) 15 MG tablet Take 15 mg by mouth daily.      Scheduled:  [START ON 05/16/2023] cholecalciferol  2,000 Units Oral Daily   vitamin B-12  2,000 mcg Oral Daily   enoxaparin (LOVENOX) injection  40 mg Subcutaneous Q24H   Vitamin D (Ergocalciferol)  50,000 Units Oral Q7 days    Assessment: 67 yo female with a PMHx of vertigo who presented to the ED on 4/27 for assessment of new-onset right hemichorea that began one week PTA and which had been worsening since that time. She was not hyperglycemic and she has no family history of movement  disorders. - Exam today is slightly improved from yesterday. There is continued right hemichorea involving her face, arm and leg, but less prominently than on prior exams.  - Labs:  - UDS negative - HIV negative - Ribonucleic protein elevated at 1.5. Lupus panel otherwise negative. Significance of this finding is uncertain given that the remainder of the panel is negative.  - Vitamin D came back low at 10.26 ng/mL (moderate deficiency bordering on severe based on published cutoffs). The literature documents rare cases of hyperkinetic movement disorders including hemichorea, in association with vitamin D deficiency. Will need supplementation.  - Vitamin B12 came back low at 146. The literature also documents a rare association between B12 deficiency and the development of chorea. Supplemental high-dose B12 at 2000 mcg po per day has been started.  - Ceroloplasmin level is normal.  - MRI brain with and without contrast: No acute intracranial abnormality. Atrophy with mild chronic small vessel ischemic disease and a few scattered remote lacunar infarcts about the thalami and left cerebellum. No acute abnormality.  - Fluoro-guided LP performed today (4/30): Glucose, WBC, protein normal. Clear and colorless CSF.  - DDx: - We suspect culprit may be the transvaginal estrogen she began one month ago. There are cases in the literature of postmenopausal women developing chorea after starting HRT.  - As noted above, vitamin D and B12 deficiencies are rare causes of chorea. Supplementation has been started.  - DDx also includes autoimmune or inflammatory processes with SLE now being lower  on the DDx given only one component of the panel being positive. Sjogrens syndrome essentially ruled out due to negative anti-Ro, anti-La and antinuclear antibody levels. Other inflammatory etiologies that are still significant components of the DDx include antiphospholipid syndrome and paraneoplastic syndrome.  - Wilson's  disease essentially ruled out given her age and normal ceruloplasmin level    Recommendations:  - Transvaginal estrogen has been stopped. Continue off this medication and other estrogen compounds indefinitely.  - Ordered labs: - Antiphospholipid panel pending - Serum copper pending - IgG index, oligoclonal bands and CSF VZV PCR are pending.  - Serum paraneoplastic panel is pending.  - Continue vitamin B12 supplementation indefinitely.  - Starting loading dose regimen for vitamin D: 50,000 IU of vitamin D orally once weekly for 3 months. After completing loading dose regimen, continue with daily vitamin D supplementation at 2000 IU/day thereafter (ordered). - Due to hypotension and lightheadedness during PT today, she is undergoing a work up for orthostatic hypotension. TTE is completed with report pending.  - Although she has had no sore throat in the last 3 months, there is some chance of rheumatic fever, given chorea and carditis. Hospitalist has ordered ASO, ESR and CRP.    LOS: 2 days   @Electronically  signed: Dr. Caryl Pina 02/15/2023  12:39 PM

## 2023-02-16 DIAGNOSIS — E538 Deficiency of other specified B group vitamins: Secondary | ICD-10-CM | POA: Diagnosis not present

## 2023-02-16 DIAGNOSIS — I951 Orthostatic hypotension: Secondary | ICD-10-CM | POA: Diagnosis not present

## 2023-02-16 DIAGNOSIS — G255 Other chorea: Secondary | ICD-10-CM | POA: Diagnosis not present

## 2023-02-16 LAB — ANTIPHOSPHOLIPID SYNDROME EVAL, BLD
Anticardiolipin IgA: <9 APL U/mL (ref 0–11)
Anticardiolipin IgG: <9 GPL U/mL (ref 0–14)
Anticardiolipin IgM: <9 MPL U/mL (ref 0–12)
DRVVT: 29.9 s (ref 0.0–47.0)
PTT Lupus Anticoagulant: 33.8 s (ref 0.0–43.5)
Phosphatydalserine, IgA: <1 APS Units (ref 0–19)
Phosphatydalserine, IgG: 10 Units (ref 0–30)
Phosphatydalserine, IgM: <10 Units (ref 0–30)

## 2023-02-16 LAB — IGG CSF INDEX
Albumin CSF-mCnc: 18 mg/dL (ref 8–37)
Albumin: 3.5 g/dL — ABNORMAL LOW (ref 3.9–4.9)
CSF IgG Index: 0.5 (ref 0.0–0.7)
IgG (Immunoglobin G), Serum: 832 mg/dL (ref 586–1602)
IgG, CSF: 2.2 mg/dL (ref 0.0–6.7)
IgG/Alb Ratio, CSF: 0.12 (ref 0.00–0.25)

## 2023-02-16 LAB — CBC
HCT: 39.3 % (ref 36.0–46.0)
Hemoglobin: 13.3 g/dL (ref 12.0–15.0)
MCH: 30.1 pg (ref 26.0–34.0)
MCHC: 33.8 g/dL (ref 30.0–36.0)
MCV: 88.9 fL (ref 80.0–100.0)
Platelets: 189 10*3/uL (ref 150–400)
RBC: 4.42 MIL/uL (ref 3.87–5.11)
RDW: 12.2 % (ref 11.5–15.5)
WBC: 5.9 10*3/uL (ref 4.0–10.5)
nRBC: 0 % (ref 0.0–0.2)

## 2023-02-16 LAB — BASIC METABOLIC PANEL
Anion gap: 7 (ref 5–15)
BUN: 12 mg/dL (ref 8–23)
CO2: 25 mmol/L (ref 22–32)
Calcium: 9 mg/dL (ref 8.9–10.3)
Chloride: 106 mmol/L (ref 98–111)
Creatinine, Ser: 1.01 mg/dL — ABNORMAL HIGH (ref 0.44–1.00)
GFR, Estimated: >60 mL/min (ref >60)
Glucose, Bld: 97 mg/dL (ref 70–99)
Potassium: 3.5 mmol/L (ref 3.5–5.1)
Sodium: 138 mmol/L (ref 135–145)

## 2023-02-16 LAB — URINALYSIS, ROUTINE W REFLEX MICROSCOPIC
Bilirubin Urine: NEGATIVE
Glucose, UA: NEGATIVE mg/dL
Hgb urine dipstick: NEGATIVE
Ketones, ur: NEGATIVE mg/dL
Nitrite: NEGATIVE
Protein, ur: NEGATIVE mg/dL
Specific Gravity, Urine: 1.004 — ABNORMAL LOW (ref 1.005–1.030)
pH: 6 (ref 5.0–8.0)

## 2023-02-16 LAB — MAGNESIUM: Magnesium: 2.2 mg/dL (ref 1.7–2.4)

## 2023-02-16 LAB — CORTISOL: Cortisol, Plasma: 7.5 ug/dL

## 2023-02-16 LAB — VZV PCR, CSF: VZV PCR, CSF: NEGATIVE

## 2023-02-16 MED ORDER — SODIUM CHLORIDE 0.9 % IV SOLN
1000.0000 mg | Freq: Every day | INTRAVENOUS | Status: AC
  Administered 2023-02-16 – 2023-02-20 (×5): 1000 mg via INTRAVENOUS
  Filled 2023-02-16 (×6): qty 16

## 2023-02-16 NOTE — Progress Notes (Signed)
Occupational Therapy Treatment Patient Details Name: Felicia Dunn MRN: [id number] DOB: 08-12-1956 Today's Date: 02/16/2023   History of present illness Felicia Dunn is a 67yoF, PMH unremarkable, comes to [facility] ED after acute, insidious onset Rt hemibody choreiform movements of arm, leg, and of orofacial complex. Patient DC home from ED after negative workup, plan to FU wth outpatient neurology, but pt returned to ED next day due to progressive symptoms. Repeat MRI again remains unremarkable for acute pathology. At baseline pt is a community dwelling adult, fully independent, no prior falls history or imbalance, no use of AD prior to arrival.   OT comments  Pt seen for OT tx this date. Pt completed bed mobility and STS transfers with RW with CGA for safety.  Pt ambulated from EOB to bathroom with CGA + RW. BSC placed over toilet for elevation and pt endorsed improved ease with toilet transfer given her height and the low toilet. Pt endorsed symptoms with positional changes, worsening with time but improving with return to supine. Pt/family educated in home/routines modifications and falls prevention strategies 2/2 symptomatic positional changes. Pt continues to benefit from skilled OT services.    Recommendations for follow up therapy are one component of a multi-disciplinary discharge planning process, led by the attending physician.  Recommendations may be updated based on patient status, additional functional criteria and insurance authorization.    Assistance Recommended at Discharge Frequent or constant Supervision/Assistance  Patient can return home with the following  A little help with walking and/or transfers;A little help with bathing/dressing/bathroom;Assistance with cooking/housework;Assist for transportation;Help with stairs or ramp for entrance   Equipment Recommendations  None recommended by OT    Recommendations for Other Services      Precautions / Restrictions  Precautions Precautions: Fall Restrictions Weight Bearing Restrictions: No       Mobility Bed Mobility Overal bed mobility: Needs Assistance Bed Mobility: Supine to Sit, Sit to Supine     Supine to sit: Min guard Sit to supine: Min guard        Transfers Overall transfer level: Needs assistance Equipment used: Rolling walker (2 wheels) Transfers: Sit to/from Stand Sit to Stand: Min guard, Supervision                 Balance Overall balance assessment: Needs assistance Sitting-balance support: Single extremity supported, Bilateral upper extremity supported, No upper extremity supported, Feet supported, Feet unsupported Sitting balance-Leahy Scale: Fair     Standing balance support: Single extremity supported, Bilateral upper extremity supported, During functional activity, Reliant on assistive device for balance Standing balance-Leahy Scale: Fair                             ADL either performed or assessed with clinical judgement   ADL Overall ADL's : Needs assistance/impaired     Grooming: Standing;Wash/dry hands;Min guard                   Toilet Transfer: Min guard;Ambulation;Rolling walker (2 wheels) Toilet Transfer Details (indicate cue type and reason): BSC over toilet to improve safety/indep with transfers                Extremity/Trunk Assessment              Vision       Perception     Praxis      Cognition Arousal/Alertness: Awake/alert Behavior During Therapy: WFL for tasks assessed/performed Overall Cognitive Status: Within Functional Limits  for tasks assessed                                          Exercises Other Exercises Other Exercises: Pt/family educated in safety/falls prevention if experiencing symptoms with positional changes    Shoulder Instructions       General Comments      Pertinent Vitals/ Pain       Pain Assessment Pain Assessment: No/denies pain  Home Living                                           Prior Functioning/Environment              Frequency  Min 1X/week        Progress Toward Goals  OT Goals(current goals can now be found in the care plan section)  Progress towards OT goals: Progressing toward goals  Acute Rehab OT Goals Patient Stated Goal: get better OT Goal Formulation: With patient/family Time For Goal Achievement: 02/27/23 Potential to Achieve Goals: Good  Plan Discharge plan remains appropriate;Frequency remains appropriate    Co-evaluation                 AM-PAC OT "6 Clicks" Daily Activity     Outcome Measure   Help from another person eating meals?: None Help from another person taking care of personal grooming?: A Little Help from another person toileting, which includes using toliet, bedpan, or urinal?: A Little Help from another person bathing (including washing, rinsing, drying)?: A Lot Help from another person to put on and taking off regular upper body clothing?: A Little Help from another person to put on and taking off regular lower body clothing?: A Lot 6 Click Score: 17    End of Session Equipment Utilized During Treatment: Rolling walker (2 wheels)  OT Visit Diagnosis: Other abnormalities of gait and mobility (R26.89)   Activity Tolerance Other (comment) (increasing lightheadedness and weakness with ADL mobility)   Patient Left in bed;with call bell/phone within reach;with bed alarm set;with family/visitor present   Nurse Communication          Time: 9604-5409 OT Time Calculation (min): 15 min  Charges: OT General Charges $OT Visit: 1 Visit OT Treatments $Self Care/Home Management : 8-22 mins  Arman Filter., MPH, MS, OTR/L ascom [phone] 02/16/23, 1:13 PM

## 2023-02-16 NOTE — Progress Notes (Signed)
   02/16/23 1030  Therapy Vitals  Patient Position (if appropriate) Orthostatic Vitals  Orthostatic Lying   BP- Lying 131/76  Pulse- Lying 79  Orthostatic Sitting  BP- Sitting 112/67  Pulse- Sitting 91  Orthostatic Standing at 0 minutes  BP- Standing at 0 minutes 97/61  Pulse- Standing at 0 minutes 99  Orthostatic Standing at 3 minutes  BP- Standing at 3 minutes 95/68 (1 minute)  Pulse- Standing at 3 minutes 112 (1 minute BP check)   Orthos taken during PT session. Pt appears grossly similar to previous day. Symptoms when up are minimal, most described as an increase in gross fatigue. Pt AMB thereafter, but distance limited by author.   10:34 AM, 02/16/23 Rosamaria Lints, PT, DPT Physical Therapist - [facility]  [phone] [facility])

## 2023-02-16 NOTE — Progress Notes (Signed)
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   Objective: Current vital signs: BP 129/79 (BP Location: Right Arm)   Pulse 65   Temp 97.6 F (36.4 C)   Resp 18   Ht 6\' 5"  (1.956 m)   Wt 99.8 kg   SpO2 (!) 87%   BMI 26.09 kg/m  Vital signs in last 24 hours: Temp:  [97.6 F (36.4 C)-98.4 F (36.9 C)] 97.6 F (36.4 C) (05/02 0846) Pulse Rate:  [65-80] 65 (05/02 0846) Resp:  [18-19] 18 (05/02 0846) BP: (112-129)/(48-98) 129/79 (05/02 0846) SpO2:  [87 %-96 %] 87 % (05/02 0846)  Intake/Output from previous day: 05/01 0701 - 05/02 0700 In: 240 [P.O.:240] Out: -  Intake/Output this shift: No intake/output data recorded. Nutritional status:  Diet Order             Diet regular Room service appropriate? Yes; Fluid consistency: Thin  Diet effective now                  Physical Exam HEENT- [place]/AT   Lungs- Respirations unlabored Extremities- No cyanosis or pallor.    Neurological Examination Mental Status: Awake and alert. Fully oriented. Thought content appropriate.  Speech fluent without evidence of aphasia.  Able to follow all commands without difficulty. Cranial Nerves: II, III,IV, VI: No ptosis. EOMI. No nystagmus. Fixates and tracks normally.  VII: Smile symmetric. Intermittent choreiform movements of right side of face are noted, more prominent than yesterday.  VIII: Hearing intact to voice IX,X: No hypophonia or hoarseness XI: Symmetric XII: No lingual dysarthria.  Motor: Constant right-sided moderate amplitude hemichorea that is present both with and without intentional movements of her right arm and leg. More pronounced than yesterday.  Cerebellar: No ataxia disproportionate to her chorea on the right.  Gait: Deferred  Lab Results: Results for orders placed or performed during the hospital encounter of 02/12/23 (from the past 48 hour(s))  IgG CSF index     Status: Abnormal   Collection Time: 02/14/23 11:10 AM  Result Value Ref Range   IgG,  CSF 2.2 0.0 - 6.7 mg/dL   Albumin CSF-mCnc 18 8 - 37 mg/dL   IgG (Immunoglobin G), Serum 832 586 - 1,602 mg/dL   Albumin 3.5 (L) 3.9 - 4.9 g/dL   IgG/Alb Ratio, CSF 6.57 0.00 - 0.25   CSF IgG Index 0.5 0.0 - 0.7    Comment: (NOTE) Performed At: [facility] [address] [place], [place] [id number] Jolene Schimke MD WU:[id number]   CSF cell count with differential     Status: Abnormal   Collection Time: 02/14/23 11:10 AM  Result Value Ref Range   Tube # 3    Color, CSF COLORLESS COLORLESS   Appearance, CSF CLEAR CLEAR   Supernatant NOT APPLICABLE    RBC Count, CSF 5 (H) 0 - 3 /cu mm    Comment: CORRECTED ON 04/30 AT 1305: PREVIOUSLY REPORTED AS 0   WBC, CSF 5 0 - 5 /cu mm    Comment: CORRECTED RESULTS CALLED TO: AMANDA PEREZ 1300 02/14/23 MU CORRECTED ON 04/30 AT 1305: PREVIOUSLY REPORTED AS 40 CRITICAL RESULT CALLED TO, READ BACK BY AND VERIFIED WITH: AMANDA PEREZ 1236 02/14/23 MU    Segmented Neutrophils-CSF 6 %   Lymphs, CSF 50 %   Monocyte-Macrophage-Spinal Fluid 44 %   Eosinophils, CSF 0 %    Comment: Performed at [facility], [address] [place], [place] [address]  Protein and glucose, CSF     Status: None  Collection Time: 02/14/23 11:10 AM  Result Value Ref Range   Glucose, CSF 50 40 - 70 mg/dL   Total  Protein, CSF 30 15 - 45 mg/dL    Comment: Performed at [facility], [address] [place], [place] [address]  Basic metabolic panel     Status: Abnormal   Collection Time: 02/15/23  5:05 AM  Result Value Ref Range   Sodium 136 135 - 145 mmol/L   Potassium 3.7 3.5 - 5.1 mmol/L   Chloride 108 98 - 111 mmol/L   CO2 24 22 - 32 mmol/L   Glucose, Bld 92 70 - 99 mg/dL    Comment: Glucose reference range applies only to samples taken after fasting for at least 8 hours.   BUN 12 8 - 23 mg/dL   Creatinine, Ser 6.04 (H) 0.44 - 1.00 mg/dL   Calcium 8.7 (L) 8.9 - 10.3 mg/dL   GFR, Estimated >54 >09 mL/min    Comment: (NOTE) Calculated  using the CKD-EPI Creatinine Equation (2021)    Anion gap 4 (L) 5 - 15    Comment: Performed at [facility], [address] [place], [place] [address]  CBC     Status: None   Collection Time: 02/15/23  5:05 AM  Result Value Ref Range   WBC 5.5 4.0 - 10.5 K/uL   RBC 4.34 3.87 - 5.11 MIL/uL   Hemoglobin 12.9 12.0 - 15.0 g/dL   HCT 47.8 29.5 - 62.1 %   MCV 88.5 80.0 - 100.0 fL   MCH 29.7 26.0 - 34.0 pg   MCHC 33.6 30.0 - 36.0 g/dL   RDW 30.8 65.7 - 84.6 %   Platelets 181 150 - 400 K/uL   nRBC 0.0 0.0 - 0.2 %    Comment: Performed at [facility], [address] [place], [place] [address]  Magnesium     Status: None   Collection Time: 02/15/23  5:05 AM  Result Value Ref Range   Magnesium 2.1 1.7 - 2.4 mg/dL    Comment: Performed at [facility], [address] [place], [place] [address]  Sedimentation rate     Status: None   Collection Time: 02/15/23 12:35 PM  Result Value Ref Range   Sed Rate 11 0 - 30 mm/hr    Comment: Performed at [facility], [address] [place], [place] [address]  C-reactive protein     Status: None   Collection Time: 02/15/23 12:35 PM  Result Value Ref Range   CRP 0.8 <1.0 mg/dL    Comment: Performed at [facility], [address] [place], [place] [address]  Troponin I (High Sensitivity)     Status: None   Collection Time: 02/15/23 12:35 PM  Result Value Ref Range   Troponin I (High Sensitivity) 13 <18 ng/L    Comment: (NOTE) Elevated high sensitivity troponin I (hsTnI) values and significant  changes across serial measurements may suggest ACS but many other  chronic and acute conditions are known to elevate hsTnI results.  Refer to the "Links" section for chest pain algorithms and additional  guidance. Performed at [facility], [address] [place], [place] [address]   TSH     Status: None   Collection Time: 02/15/23 12:35 PM  Result Value Ref Range   TSH 1.229 0.350 - 4.500  uIU/mL    Comment: Performed by a 3rd Generation assay with a functional sensitivity of <=0.01 uIU/mL. Performed at [facility],  [address], [place], [place] [address]   Basic metabolic panel     Status: Abnormal   Collection Time: 02/16/23  6:15 AM  Result Value Ref Range   Sodium 138 135 - 145 mmol/L   Potassium 3.5 3.5 - 5.1 mmol/L   Chloride 106 98 - 111 mmol/L   CO2 25 22 - 32 mmol/L   Glucose, Bld 97 70 - 99 mg/dL    Comment: Glucose reference range applies only to samples taken after fasting for at least 8 hours.   BUN 12 8 - 23 mg/dL   Creatinine, Ser 5.28 (H) 0.44 - 1.00 mg/dL   Calcium 9.0 8.9 - 41.3 mg/dL   GFR, Estimated >24 >40 mL/min    Comment: (NOTE) Calculated using the CKD-EPI Creatinine Equation (2021)    Anion gap 7 5 - 15    Comment: Performed at [facility], [address] [place], [place] [address]  CBC     Status: None   Collection Time: 02/16/23  6:15 AM  Result Value Ref Range   WBC 5.9 4.0 - 10.5 K/uL   RBC 4.42 3.87 - 5.11 MIL/uL   Hemoglobin 13.3 12.0 - 15.0 g/dL   HCT 53.6 64.4 - 03.4 %   MCV 88.9 80.0 - 100.0 fL   MCH 30.1 26.0 - 34.0 pg   MCHC 33.8 30.0 - 36.0 g/dL   RDW 74.2 59.5 - 63.8 %   Platelets 189 150 - 400 K/uL   nRBC 0.0 0.0 - 0.2 %    Comment: Performed at [facility], [address] [place], [place] [address]  Magnesium     Status: None   Collection Time: 02/16/23  6:15 AM  Result Value Ref Range   Magnesium 2.2 1.7 - 2.4 mg/dL    Comment: Performed at [facility], [address] [place], [place] [address]    No results found for this or any previous visit (from the past 240 hour(s)).  Lipid Panel No results for input(s): "CHOL", "TRIG", "HDL", "CHOLHDL", "VLDL", "LDLCALC" in the last 72 hours.  Studies/Results: ECHOCARDIOGRAM COMPLETE  Result Date: 02/15/2023    ECHOCARDIOGRAM REPORT   Patient Name:   Felicia Dunn Date of Exam: 02/15/2023 Medical Rec #:  [id number]        Height:       77.0 in Accession #:    [id number]      Weight:       220.0 lb Date of Birth:  1956-02-28       BSA:          2.329 m Patient Age:    56 years        BP:           128/69 mmHg Patient Gender: F               HR:           75 bpm. Exam Location:  [facility] Procedure: 2D Echo, Cardiac Doppler and Color Doppler Indications:     Dilated cardiomyopathy  History:         Patient has no prior history of Echocardiogram examinations.                  Cardiomyopathy; Risk Factors:Dyslipidemia.  Sonographer:     Mikki Harbor Referring Phys:  [id number] Marrion Coy Diagnosing Phys: Debbe Odea MD IMPRESSIONS  1. Left ventricular ejection fraction, by estimation, is 45 to 50%. The left ventricle has mildly decreased  function. The left ventricle demonstrates global hypokinesis. Left ventricular diastolic parameters are consistent with Grade I diastolic dysfunction (impaired relaxation).  2. Right ventricular systolic function is normal. The right ventricular size is normal. There is normal pulmonary artery systolic pressure.  3. The mitral valve is normal in structure. No evidence of mitral valve regurgitation.  4. The aortic valve is tricuspid. Aortic valve regurgitation is not visualized. FINDINGS  Left Ventricle: Left ventricular ejection fraction, by estimation, is 45 to 50%. The left ventricle has mildly decreased function. The left ventricle demonstrates global hypokinesis. The left ventricular internal cavity size was normal in size. There is  no left ventricular hypertrophy. Left ventricular diastolic parameters are consistent with Grade I diastolic dysfunction (impaired relaxation). Right Ventricle: The right ventricular size is normal. No increase in right ventricular wall thickness. Right ventricular systolic function is normal. There is normal pulmonary artery systolic pressure. The tricuspid regurgitant velocity is 2.42 m/s, and  with an assumed right atrial pressure of 3 mmHg, the estimated right  ventricular systolic pressure is 26.4 mmHg. Left Atrium: Left atrial size was normal in size. Right Atrium: Right atrial size was normal in size. Pericardium: There is no evidence of pericardial effusion. Mitral Valve: The mitral valve is normal in structure. No evidence of mitral valve regurgitation. MV peak gradient, 3.3 mmHg. The mean mitral valve gradient is 1.0 mmHg. Tricuspid Valve: The tricuspid valve is normal in structure. Tricuspid valve regurgitation is mild. Aortic Valve: The aortic valve is tricuspid. Aortic valve regurgitation is not visualized. Aortic valve mean gradient measures 2.0 mmHg. Aortic valve peak gradient measures 4.7 mmHg. Aortic valve area, by VTI measures 3.02 cm. Pulmonic Valve: The pulmonic valve was not well visualized. Pulmonic valve regurgitation is not visualized. Aorta: The aortic root is normal in size and structure. Venous: The inferior vena cava was not well visualized. IAS/Shunts: No atrial level shunt detected by color flow Doppler.  LEFT VENTRICLE PLAX 2D LVIDd:         4.30 cm   Diastology LVIDs:         3.20 cm   LV e' medial:    8.16 cm/s LV PW:         1.30 cm   LV E/e' medial:  8.5 LV IVS:        1.20 cm   LV e' lateral:   9.90 cm/s LVOT diam:     2.00 cm   LV E/e' lateral: 7.0 LV SV:         68 LV SV Index:   29 LVOT Area:     3.14 cm  RIGHT VENTRICLE RV Basal diam:  3.40 cm RV Mid diam:    3.20 cm RV S prime:     11.70 cm/s LEFT ATRIUM             Index        RIGHT ATRIUM           Index LA diam:        3.20 cm 1.37 cm/m   RA Area:     17.60 cm LA Vol (A2C):   33.1 ml 14.21 ml/m  RA Volume:   47.90 ml  20.56 ml/m LA Vol (A4C):   27.3 ml 11.72 ml/m LA Biplane Vol: 30.3 ml 13.01 ml/m  AORTIC VALVE                    PULMONIC VALVE AV Area (Vmax):    2.65 cm  PV Vmax:       0.96 m/s AV Area (Vmean):   2.72 cm     PV Peak grad:  3.7 mmHg AV Area (VTI):     3.02 cm AV Vmax:           108.00 cm/s AV Vmean:          68.000 cm/s AV VTI:            0.226 m AV  Peak Grad:      4.7 mmHg AV Mean Grad:      2.0 mmHg LVOT Vmax:         91.00 cm/s LVOT Vmean:        58.900 cm/s LVOT VTI:          0.217 m LVOT/AV VTI ratio: 0.96  AORTA Ao Root diam: 3.50 cm MITRAL VALVE               TRICUSPID VALVE MV Area (PHT): 2.73 cm    TR Peak grad:   23.4 mmHg MV Area VTI:   2.40 cm    TR Vmax:        242.00 cm/s MV Peak grad:  3.3 mmHg MV Mean grad:  1.0 mmHg    SHUNTS MV Vmax:       0.91 m/s    Systemic VTI:  0.22 m MV Vmean:      44.6 cm/s   Systemic Diam: 2.00 cm MV Decel Time: 278 msec MV E velocity: 69.20 cm/s MV A velocity: 88.60 cm/s MV E/A ratio:  0.78 Debbe Odea MD Electronically signed by Debbe Odea MD Signature Date/Time: 02/15/2023/5:16:55 PM    Final    DG FL GUIDED LUMBAR PUNCTURE  Result Date: 02/14/2023 CLINICAL DATA:  Patient with right-sided hemichorea. Diagnostic lumbar puncture requested. EXAM: DIAGNOSTIC LUMBAR PUNCTURE UNDER FLUOROSCOPIC GUIDANCE COMPARISON:  None Available. FLUOROSCOPY: Radiation Exposure Index (as provided by the fluoroscopic device): 7.80 mGy Kerma PROCEDURE: Informed consent was obtained from the patient prior to the procedure, including potential complications of headache, allergy, and pain. With the patient prone, the lower back was prepped with Betadine. 1% Lidocaine was used for local anesthesia. Lumbar puncture was performed at the L3/L4 level using a 20 gauge needle with return of clear CSF with an opening pressure of 23 cm water. 13 ml of CSF were obtained for laboratory studies. The patient tolerated the procedure well and there were no apparent complications. IMPRESSION: Technically successful L3-L4 lumbar puncture yielding 13 mL of clear CSF for laboratory studies. Procedure performed by Alwyn Ren, NP Electronically Signed   By: Simonne Come M.D.   On: 02/14/2023 12:24    Medications: Scheduled:  [START ON 05/16/2023] cholecalciferol  2,000 Units Oral Daily   vitamin B-12  2,000 mcg Oral Daily   enoxaparin  (LOVENOX) injection  40 mg Subcutaneous Q24H   Vitamin D (Ergocalciferol)  50,000 Units Oral Q7 days   Continuous:  methylPREDNISolone (SOLU-MEDROL) injection     Assessment: 67 yo female with a PMHx of vertigo who presented to the ED on 4/27 for assessment of new-onset right hemichorea that began one week PTA and which had been worsening since that time. She was not hyperglycemic and she has no family history of movement disorders. - Exam today is slightly worse from yesterday. There is continued right hemichorea involving her face, arm and leg.  - Labs:  - UDS negative - HIV negative - CSF IgG index normal - ESR and CRP normal - Ribonucleic protein  elevated at 1.5. Lupus panel otherwise negative. Significance of this finding is uncertain given that the remainder of the panel is negative.  - Vitamin D came back low at 10.26 ng/mL (moderate deficiency bordering on severe based on published cutoffs). The literature documents rare cases of hyperkinetic movement disorders including hemichorea, in association with vitamin D deficiency. Will need supplementation.  - Vitamin B12 came back low at 146. The literature also documents a rare association between B12 deficiency and the development of chorea. Supplemental high-dose B12 at 2000 mcg po per day has been started.  - Ceroloplasmin level is normal.  - Serum copper level borderline low at 78. Not likely of clinical significance - MRI brain with and without contrast: No acute intracranial abnormality. Atrophy with mild chronic small vessel ischemic disease and a few scattered remote lacunar infarcts about the thalami and left cerebellum. No acute abnormality.  - TTE: Left ventricular ejection fraction, by estimation, is 45 to 50%. The left ventricle has mildly decreased function and demonstrates global hypokinesis. Left ventricular diastolic parameters are  consistent with Grade I diastolic dysfunction (impaired relaxation). Right ventricular systolic  function is normal. The right ventricular size is normal. There is normal pulmonary artery systolic pressure. The mitral valve is normal in structure. No evidence of mitral valve regurgitation. The aortic valve is tricuspid. Aortic valve regurgitation is not visualized.  - Fluoro-guided LP performed 4/30: Glucose, WBC, protein normal. Clear and colorless CSF.  - DDx: - Most likely etiology now felt to be autoimmune - We have also suspected that the culprit may be the transvaginal estrogen she began one month ago. There are cases in the literature of postmenopausal women developing chorea after starting HRT.  - As noted above, vitamin D and B12 deficiencies are rare causes of chorea. Supplementation has been started.  - DDx also includes autoimmune or inflammatory processes with SLE now being lower on the DDx given only one component of the panel being positive. Sjogrens syndrome essentially ruled out due to negative anti-Ro, anti-La and antinuclear antibody levels. Other inflammatory etiologies that are still significant components of the DDx include antiphospholipid syndrome and paraneoplastic syndrome.  - Wilson's disease essentially ruled out given her age and normal ceruloplasmin level    Recommendations:  - Transvaginal estrogen has been stopped. Continue off this medication and other estrogen compounds indefinitely.  - Ordered labs: - Antiphospholipid panel pending - Oligoclonal bands and CSF VZV PCR are pending.  - Serum paraneoplastic panel is pending.  - Continue vitamin B12 supplementation indefinitely.  - On 3 month loading dose regimen for vitamin D: 50,000 IU of vitamin D orally once weekly for 3 months. After completing loading dose regimen, continue with daily vitamin D supplementation at 2000 IU/day thereafter (ordered). - Due to hypotension and lightheadedness during PT yesterday, she is undergoing a work up for orthostatic hypotension.   - Although she has had no sore throat in  the last 3 months, there is some chance of rheumatic fever, given chorea and carditis. Hospitalist has ordered ASO - Regarding possible copper supplementation, she does not have clinical findings of such including no myelopathic symptoms or findings on exam, is not anemic, is not hypothermic and does not have low white blood cell count. Although copper toxicity is rare, would hold off on supplementation and recheck levels in about 6 months. The patient can increase her copper level naturally through consumption of shellfish including oysters, as well as beans, nuts, whole grains and organ meats.  - Starting  IV Solumedrol at 1000 mg qd x 5 days. Informed consent obtained from patient.    LOS: 3 days   @Electronically  signed: Dr. Caryl Pina 02/16/2023  11:06 AM

## 2023-02-16 NOTE — Progress Notes (Signed)
  Progress Note   Patient: Felicia Dunn ZOX:[id number] DOB: 28-May-1956 DOA: 02/12/2023     3 DOS: the patient was seen and examined on 02/16/2023   Brief hospital course: 67 year old with history of vertigo comes to the ER day prior to admission with complaints of 1 week of right-sided hemichorea.  Recently started on transvaginal estrogen.  Initially in the ED CT head and MRI brain were negative but did show chronic cerebral atrophy.  Patient was sent home but continued to have symptoms therefore returned back to the ED.  Repeat MRI again remains unremarkable for acute pathology.   Psych component ruled out by psychiatry team.  CSF study showed normal cell count and protein.  Patient appeared to have a vitamin D and B12 deficiency.  Started on supplements. Patient also has normal ceruloplasmin level.    Principal Problem:   Hemichorea Active Problems:   Hyperlipidemia   Vitamin B12 deficiency   CKD (chronic kidney disease) stage 2, GFR 60-89 ml/min   Orthostatic hypotension   Lung nodule   Vitamin D deficiency   Assessment and Plan:  Hemichorea. Symptoms started suddenly about a week ago, did not have any fever or respiratory symptoms. Workup including  LP appear negative.  MRI brain with and without contrast showed small vascular disease, remote lacunar infract in bilateral thalami and left cerebellar. Patient has vitamin D and vitamin B12 deficiency, but unlikely is a source. Echocardiogram showed ejection fraction 45 to 50% without valvular disease. Troponin, CRP, sed rate all normal. Discussed with neurology, not able to rule out autoimmune process.  Neurology has started the high-dose of steroids.  Will continue this for 5 days.   Orthostatic hypotension. LV systolic dysfunction. Patient had another episode of dizziness with drop blood pressure while working with occupational therapy.  Patient has been started on high-dose steroids, will continue monitor blood pressure. Will  also hold treatment for LV systolic dysfunction for now.   Vitamin B12 deficiency without anemia. Vitamin D deficiency. Continue vitamin D and vitamin B12.   Hypokalemia. Hypomagnesemia. Resolved.   Lung nodule. Small lung nodule on CT chest, follow-up with PCP as outpatient.     Subjective:  Patient still has significant right-sided involuntary movement.  Physical Exam: Vitals:   02/15/23 1552 02/15/23 1928 02/16/23 0407 02/16/23 0846  BP: (!) 117/57 (!) 123/98 (!) 112/48 129/79  Pulse: 73 80 69 65  Resp: 18 19 19 18   Temp: 98.1 F (36.7 C) 98.4 F (36.9 C) 98.4 F (36.9 C) 97.6 F (36.4 C)  TempSrc:  Oral    SpO2: 96% 93% 96% (!) 87%  Weight:      Height:       General exam: Appears calm and comfortable  Respiratory system: Clear to auscultation. Respiratory effort normal. Cardiovascular system: S1 & S2 heard, RRR. No JVD, murmurs, rubs, gallops or clicks. No pedal edema. Gastrointestinal system: Abdomen is nondistended, soft and nontender. No organomegaly or masses felt. Normal bowel sounds heard. Central nervous system: Alert and oriented.  Right-sided chorea Extremities: Symmetric 5 x 5 power. Skin: No rashes, lesions or ulcers Psychiatry: Judgement and insight appear normal. Mood & affect appropriate.    Data Reviewed:  Lab results reviewed.  Family Communication: Husband updated at bedside.  Disposition: Status is: Inpatient Remains inpatient appropriate because: Severity of disease, IV treatment.     Time spent: 35 minutes  Author: Marrion Coy, MD 02/16/2023 1:12 PM  For on call review [email or web address].

## 2023-02-16 NOTE — Care Management Important Message (Signed)
Important Message  Patient Details  Name: Felicia Dunn MRN: [id number] Date of Birth: July 23, 1956   Medicare Important Message Given:  Yes     [name] 02/16/2023, 2:12 PM

## 2023-02-16 NOTE — Progress Notes (Signed)
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 Patient DC home from ED after negative workup, plan to FU wth outpatient neurology, but pt returned to ED next day due to progressive symptoms. Repeat MRI again remains unremarkable for acute pathology. At baseline pt is a community dwelling adult, fully independent, no prior falls history or imbalance, no use of AD prior to arrival.    PT Comments    Pt seated by window on entry, husband at bedside. Per husband, pt not feeling great today. Pt is agreeable to session- full set of orthostatic vitals obtained at beginning of session. Pt has minimal to no symptoms with positional changes, but does have progressive drowsiness while she is standing, assumed as part of empirical sequela of presyncope. Pt tolerates 219ft AMB without device, tolerated similar to previous 2 days, but distance is limited by author our of abundance of caution- under current situation, achieving mobility goals in greater bouts of shorter distance is a better option. Husband and son attend entire session, husband educated on providing safety assist while her pressures remain aberrant. Will continue to follow.   Orthostatic VS for the past 24 hrs:  BP- Lying Pulse- Lying BP- Sitting Pulse- Sitting BP- Sitting at 1 minutes Pulse- Sitting at 1 minutes BP- Standing at 0 minutes Pulse- Standing at 0 minutes BP- Standing at 1 minutes Pulse- Standing at 1 minutes  02/16/23 1030 131/76 79 112/67 91 112/67 92 97/61 99 95/68 112  02/15/23 1120 -- -- 128/80 113   112/58 110        Recommendations for follow up therapy are one component of a multi-disciplinary discharge planning process, led by the attending physician.   Recommendations may be updated based on patient status, additional functional criteria and insurance authorization.  Follow Up Recommendations       Assistance Recommended at Discharge Set up Supervision/Assistance  Patient can return home with the following A little help with walking and/or transfers;Assist for transportation   Equipment Recommendations  Rolling walker (2 wheels)    Recommendations for Other Services       Precautions / Restrictions Precautions Precautions: Fall Restrictions Weight Bearing Restrictions: No     Mobility  Bed Mobility Overal bed mobility: Needs Assistance Bed Mobility: Supine to Sit, Sit to Supine     Supine to sit: Min assist, Min guard Sit to supine: Min assist, Min guard   General bed mobility comments: husband provides assist out of love>necessity; pt appears grossly slow and weak (unchanged)    Transfers Overall transfer level: Needs assistance Equipment used: None Transfers: Sit to/from Stand                  Ambulation/Gait   Gait Distance (Feet): 200 Feet Assistive device: None Gait Pattern/deviations: Staggering right, Staggering left, Ataxic, Drifts right/left, Wide base of support       General Gait Details: unchanged from previous days: appears heavily somnolent when up walking; starting/stopping, drifting L/R, wide based support with intermittent crossover. Rt hemichorea not as noticeable among these other variables.   Stairs             Wheelchair Mobility    Modified Rankin (Stroke Patients Only)       Balance Overall balance assessment: Needs assistance  Cognition Arousal/Alertness: Awake/alert Behavior During Therapy: WFL for tasks assessed/performed Overall Cognitive Status: Within Functional Limits for tasks assessed                                          Exercises      General Comments         Pertinent Vitals/Pain Pain Assessment Pain Assessment: No/denies pain    Home Living                          Prior Function            PT Goals (current goals can now be found in the care plan section) Acute Rehab PT Goals Patient Stated Goal: restored baseline gait PT Goal Formulation: With patient Time For Goal Achievement: 02/28/23 Potential to Achieve Goals: Poor Progress towards PT goals: Not progressing toward goals - comment    Frequency    Min 4X/week      PT Plan Current plan remains appropriate    Co-evaluation              AM-PAC PT "6 Clicks" Mobility   Outcome Measure  Help needed turning from your back to your side while in a flat bed without using bedrails?: None Help needed moving from lying on your back to sitting on the side of a flat bed without using bedrails?: None Help needed moving to and from a bed to a chair (including a wheelchair)?: A Little Help needed standing up from a chair using your arms (e.g., wheelchair or bedside chair)?: A Little Help needed to walk in hospital room?: A Little Help needed climbing 3-5 steps with a railing? : A Little 6 Click Score: 20    End of Session Equipment Utilized During Treatment: Gait belt Activity Tolerance: Patient tolerated treatment well;Patient limited by fatigue;Treatment limited secondary to medical complications (Comment);No increased pain Patient left: in bed;with family/visitor present;with call bell/phone within reach Nurse Communication: Mobility status PT Visit Diagnosis: Unsteadiness on feet (R26.81);Other abnormalities of gait and mobility (R26.89)     Time: 1005-1030 PT Time Calculation (min) (ACUTE ONLY): 25 min  Charges:  $Therapeutic Activity: 23-37 mins                    10:48 AM, 02/16/23 Rosamaria Lints, PT, DPT Physical Therapist - [facility]  [phone] (ASCOM)    Felicia Dunn 02/16/2023, 10:40 AM

## 2023-02-16 NOTE — Progress Notes (Signed)
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 PMH includes stress incontinence, arthritis, vertigo.   Goal of Therapy:  Electrolytes within normal limits  Plan:  No replacement warranted today Follow up BMP and Mag tomorrow AM    Elliot Gurney, PharmD, BCPS Clinical Pharmacist  02/16/2023 7:14 AM

## 2023-02-17 DIAGNOSIS — G4734 Idiopathic sleep related nonobstructive alveolar hypoventilation: Secondary | ICD-10-CM

## 2023-02-17 DIAGNOSIS — E871 Hypo-osmolality and hyponatremia: Secondary | ICD-10-CM | POA: Insufficient documentation

## 2023-02-17 DIAGNOSIS — E538 Deficiency of other specified B group vitamins: Secondary | ICD-10-CM | POA: Diagnosis not present

## 2023-02-17 DIAGNOSIS — G255 Other chorea: Secondary | ICD-10-CM | POA: Diagnosis not present

## 2023-02-17 LAB — PARANEOPLASTIC AB
AGNA-1: NEGATIVE
Amphiphysin Antibody: NEGATIVE
Anti-Hu Ab: NEGATIVE
Anti-Ri Ab: NEGATIVE
Anti-Yo Ab: NEGATIVE
Antineruonal nuclear Ab Type 3: NEGATIVE
CASPR2 Antibody,Cell-based IFA: NEGATIVE
CRMP-5 IgG: NEGATIVE
Interpretation: NEGATIVE
LGI1 Antibody, Cell-based IFA: NEGATIVE
Purkinje Cell Cyto Ab Type 2: NEGATIVE
Purkinje Cell Cyto Ab Type Tr: NEGATIVE
VGCC Antibody: <1 pmol/L (ref 0.0–30.0)

## 2023-02-17 LAB — BASIC METABOLIC PANEL
Anion gap: 8 (ref 5–15)
BUN: 13 mg/dL (ref 8–23)
CO2: 22 mmol/L (ref 22–32)
Calcium: 9.4 mg/dL (ref 8.9–10.3)
Chloride: 104 mmol/L (ref 98–111)
Creatinine, Ser: 0.93 mg/dL (ref 0.44–1.00)
GFR, Estimated: >60 mL/min (ref >60)
Glucose, Bld: 163 mg/dL — ABNORMAL HIGH (ref 70–99)
Potassium: 3.7 mmol/L (ref 3.5–5.1)
Sodium: 134 mmol/L — ABNORMAL LOW (ref 135–145)

## 2023-02-17 LAB — CBC
HCT: 39.9 % (ref 36.0–46.0)
Hemoglobin: 13.9 g/dL (ref 12.0–15.0)
MCH: 30.3 pg (ref 26.0–34.0)
MCHC: 34.8 g/dL (ref 30.0–36.0)
MCV: 86.9 fL (ref 80.0–100.0)
Platelets: 221 10*3/uL (ref 150–400)
RBC: 4.59 MIL/uL (ref 3.87–5.11)
RDW: 12 % (ref 11.5–15.5)
WBC: 4.7 10*3/uL (ref 4.0–10.5)
nRBC: 0 % (ref 0.0–0.2)

## 2023-02-17 LAB — MAGNESIUM: Magnesium: 2.1 mg/dL (ref 1.7–2.4)

## 2023-02-17 LAB — SARS CORONAVIRUS 2 BY RT PCR: SARS Coronavirus 2 by RT PCR: NEGATIVE

## 2023-02-17 NOTE — Plan of Care (Signed)

## 2023-02-17 NOTE — Progress Notes (Signed)
Patient on continuous pulse ox, O2 while asleep 88% on RA. RN placed the patient on 2L [place], O2 sat increased to 94%, no change in mentation, no SOB, RR 20   RN notified Manuela Schwartz, NP of the above information.

## 2023-02-17 NOTE — Progress Notes (Signed)
PHARMACY CONSULT NOTE   Pharmacy Consult for Electrolyte Monitoring and Replacement   Recent Labs: Potassium (mmol/L)  Date Value  02/17/2023 3.7   Magnesium (mg/dL)  Date Value  16/07/9603 2.1   Calcium (mg/dL)  Date Value  54/06/8118 9.4   Albumin (g/dL)  Date Value  14/78/2956 3.5 (L)   Sodium (mmol/L)  Date Value  02/17/2023 134 (L)     Assessment: 68 year old female admitted with hemichorea. PMH includes stress incontinence, arthritis, vertigo.   Goal of Therapy:  Electrolytes within normal limits  Plan:  Electrolytes have remain stable. Pharmacy will sign off of electrolyte replacement consult. Will monitor peripherally. Follow up BMP and Mag tomorrow AM    Elliot Gurney, PharmD, BCPS Clinical Pharmacist  02/17/2023 7:23 AM

## 2023-02-17 NOTE — Progress Notes (Signed)
Subjective: Continued right hemichorea. The patient states that it is mildly improved today. Second dose of IV Solumedrol completed just prior to neurology follow up assessment today.   Objective: Current vital signs: BP 134/84 (BP Location: Left Arm)   Pulse 81   Temp 97.6 F (36.4 C)   Resp 20   Ht 6\' 5"  (1.956 m)   Wt 99.8 kg   SpO2 97%   BMI 26.09 kg/m  Vital signs in last 24 hours: Temp:  [97.6 F (36.4 C)-98.1 F (36.7 C)] 97.6 F (36.4 C) (05/03 0731) Pulse Rate:  [76-84] 81 (05/03 0731) Resp:  [20] 20 (05/03 0731) BP: (124-135)/(65-84) 134/84 (05/03 0731) SpO2:  [93 %-97 %] 97 % (05/03 0731)  Intake/Output from previous day: 05/02 0701 - 05/03 0700 In: 300.1 [P.O.:200; IV Piggyback:100.1] Out: -  Intake/Output this shift: No intake/output data recorded. Nutritional status:  Diet Order             Diet regular Room service appropriate? Yes; Fluid consistency: Thin  Diet effective now                   Physical Exam HEENT- /AT   Lungs- Respirations unlabored Extremities- No cyanosis or pallor.    Neurological Examination Mental Status: Awake and alert. Fully oriented. Thought content appropriate. Speech fluent without evidence of aphasia.  Able to follow all commands without difficulty. Cranial Nerves: II, III,IV, VI: No ptosis. EOMI. No nystagmus. Fixates and tracks normally.  VII: Smile symmetric. Intermittent choreiform movements of right side of face are noted, similar in amplitude and frequency compared to yesterday.  VIII: Hearing intact to voice IX,X: No hypophonia or hoarseness XI: Symmetric XII: No lingual dysarthria.  Motor: Constant right-sided moderate amplitude hemichorea that is present both with and without intentional movements of her right arm and leg, similar in amplitude and frequency compared to yesterday.   Cerebellar: No ataxia disproportionate to her chorea on the right.  Gait: Deferred   Lab Results: Results for orders  placed or performed during the hospital encounter of 02/12/23 (from the past 48 hour(s))  Sedimentation rate     Status: None   Collection Time: 02/15/23 12:35 PM  Result Value Ref Range   Sed Rate 11 0 - 30 mm/hr    Comment: Performed at [facility], [address] [place], [place] [address]  C-reactive protein     Status: None   Collection Time: 02/15/23 12:35 PM  Result Value Ref Range   CRP 0.8 <1.0 mg/dL    Comment: Performed at [facility], [address] [place], [place] [address]  Troponin I (High Sensitivity)     Status: None   Collection Time: 02/15/23 12:35 PM  Result Value Ref Range   Troponin I (High Sensitivity) 13 <18 ng/L    Comment: (NOTE) Elevated high sensitivity troponin I (hsTnI) values and significant  changes across serial measurements may suggest ACS but many other  chronic and acute conditions are known to elevate hsTnI results.  Refer to the "Links" section for chest pain algorithms and additional  guidance. Performed at [facility], [address] [place], [place] [address]   TSH     Status: None   Collection Time: 02/15/23 12:35 PM  Result Value Ref Range   TSH 1.229 0.350 - 4.500 uIU/mL    Comment: Performed by a 3rd Generation assay with a functional sensitivity of <=0.01 uIU/mL. Performed at [facility], [address] [place],  [place] [address]   Cortisol     Status: None   Collection Time: 02/16/23  6:15 AM  Result Value Ref Range   Cortisol, Plasma 7.5 ug/dL    Comment: (NOTE) AM    6.7 - 22.6 ug/dL PM   <81.1       ug/dL Performed at [facility], [address], [place], [place] [address]   Basic metabolic panel     Status: Abnormal   Collection Time: 02/16/23  6:15 AM  Result Value Ref Range   Sodium 138 135 - 145 mmol/L   Potassium 3.5 3.5 - 5.1 mmol/L   Chloride 106 98 - 111 mmol/L   CO2 25 22 - 32 mmol/L   Glucose, Bld 97 70 - 99 mg/dL    Comment: Glucose reference range applies  only to samples taken after fasting for at least 8 hours.   BUN 12 8 - 23 mg/dL   Creatinine, Ser 2.95 (H) 0.44 - 1.00 mg/dL   Calcium 9.0 8.9 - 62.1 mg/dL   GFR, Estimated >30 >86 mL/min    Comment: (NOTE) Calculated using the CKD-EPI Creatinine Equation (2021)    Anion gap 7 5 - 15    Comment: Performed at [facility], [address] [place], [place] [address]  CBC     Status: None   Collection Time: 02/16/23  6:15 AM  Result Value Ref Range   WBC 5.9 4.0 - 10.5 K/uL   RBC 4.42 3.87 - 5.11 MIL/uL   Hemoglobin 13.3 12.0 - 15.0 g/dL   HCT 96.2 95.2 - 84.1 %   MCV 88.9 80.0 - 100.0 fL   MCH 30.1 26.0 - 34.0 pg   MCHC 33.8 30.0 - 36.0 g/dL   RDW 32.4 40.1 - 02.7 %   Platelets 189 150 - 400 K/uL   nRBC 0.0 0.0 - 0.2 %    Comment: Performed at [facility], [address] [place], [place] [address]  Magnesium     Status: None   Collection Time: 02/16/23  6:15 AM  Result Value Ref Range   Magnesium 2.2 1.7 - 2.4 mg/dL    Comment: Performed at [facility], [address] [place], [place] [address]  Urinalysis, Routine w reflex microscopic -Urine, Clean Catch     Status: Abnormal   Collection Time: 02/16/23  5:15 PM  Result Value Ref Range   Color, Urine STRAW (A) YELLOW   APPearance HAZY (A) CLEAR   Specific Gravity, Urine 1.004 (L) 1.005 - 1.030   pH 6.0 5.0 - 8.0   Glucose, UA NEGATIVE NEGATIVE mg/dL   Hgb urine dipstick NEGATIVE NEGATIVE   Bilirubin Urine NEGATIVE NEGATIVE   Ketones, ur NEGATIVE NEGATIVE mg/dL   Protein, ur NEGATIVE NEGATIVE mg/dL   Nitrite NEGATIVE NEGATIVE   Leukocytes,Ua MODERATE (A) NEGATIVE   RBC / HPF 0-5 0 - 5 RBC/hpf   WBC, UA 0-5 0 - 5 WBC/hpf   Bacteria, UA RARE (A) NONE SEEN   Squamous Epithelial / HPF 6-10 0 - 5 /HPF   Mucus PRESENT    Amorphous Crystal PRESENT     Comment: Performed at [facility], [address] [place], [place] [address]  Basic metabolic panel     Status: Abnormal    Collection Time: 02/17/23  5:45 AM  Result Value Ref Range   Sodium 134 (L) 135 - 145 mmol/L   Potassium 3.7 3.5 - 5.1 mmol/L   Chloride 104 98 - 111  mmol/L   CO2 22 22 - 32 mmol/L   Glucose, Bld 163 (H) 70 - 99 mg/dL    Comment: Glucose reference range applies only to samples taken after fasting for at least 8 hours.   BUN 13 8 - 23 mg/dL   Creatinine, Ser 1.61 0.44 - 1.00 mg/dL   Calcium 9.4 8.9 - 09.6 mg/dL   GFR, Estimated >04 >54 mL/min    Comment: (NOTE) Calculated using the CKD-EPI Creatinine Equation (2021)    Anion gap 8 5 - 15    Comment: Performed at [facility], [address] [place], [place] [address]  CBC     Status: None   Collection Time: 02/17/23  5:45 AM  Result Value Ref Range   WBC 4.7 4.0 - 10.5 K/uL   RBC 4.59 3.87 - 5.11 MIL/uL   Hemoglobin 13.9 12.0 - 15.0 g/dL   HCT 91.4 78.2 - 95.6 %   MCV 86.9 80.0 - 100.0 fL   MCH 30.3 26.0 - 34.0 pg   MCHC 34.8 30.0 - 36.0 g/dL   RDW 21.3 08.6 - 57.8 %   Platelets 221 150 - 400 K/uL   nRBC 0.0 0.0 - 0.2 %    Comment: Performed at [facility], [address], [place], [place] [address]  Magnesium     Status: None   Collection Time: 02/17/23  5:45 AM  Result Value Ref Range   Magnesium 2.1 1.7 - 2.4 mg/dL    Comment: Performed at [facility], [address] [place], [place] [address]    Recent Results (from the past 240 hour(s))  VZV PCR, CSF     Status: None   Collection Time: 02/14/23 11:10 AM   Specimen: Lumbar Puncture; Cerebrospinal Fluid  Result Value Ref Range Status   VZV PCR, CSF Negative Negative Final    Comment: (NOTE) No Varicella Zoster Virus DNA detected. Performed At: [facility] [address] [place], [place] [id number] Jolene Schimke MD VO:[phone]     Lipid Panel No results for input(s): "CHOL", "TRIG", "HDL", "CHOLHDL", "VLDL", "LDLCALC" in the last 72 hours.  Studies/Results: ECHOCARDIOGRAM COMPLETE  Result Date: 02/15/2023     ECHOCARDIOGRAM REPORT   Patient Name:   ROSAURA VACANTI Date of Exam: 02/15/2023 Medical Rec #:  [id number]       Height:       77.0 in Accession #:    [id number]      Weight:       220.0 lb Date of Birth:  Mar 26, 1956       BSA:          2.329 m Patient Age:    67 years        BP:           128/69 mmHg Patient Gender: F               HR:           75 bpm. Exam Location:  [facility] Procedure: 2D Echo, Cardiac Doppler and Color Doppler Indications:     Dilated cardiomyopathy  History:         Patient has no prior history of Echocardiogram examinations.                  Cardiomyopathy; Risk Factors:Dyslipidemia.  Sonographer:     Mikki Harbor Referring Phys:  [id number] Marrion Coy Diagnosing Phys: Debbe Odea MD IMPRESSIONS  1. Left ventricular ejection fraction, by estimation, is 45 to 50%.  The left ventricle has mildly decreased function. The left ventricle demonstrates global hypokinesis. Left ventricular diastolic parameters are consistent with Grade I diastolic dysfunction (impaired relaxation).  2. Right ventricular systolic function is normal. The right ventricular size is normal. There is normal pulmonary artery systolic pressure.  3. The mitral valve is normal in structure. No evidence of mitral valve regurgitation.  4. The aortic valve is tricuspid. Aortic valve regurgitation is not visualized. FINDINGS  Left Ventricle: Left ventricular ejection fraction, by estimation, is 45 to 50%. The left ventricle has mildly decreased function. The left ventricle demonstrates global hypokinesis. The left ventricular internal cavity size was normal in size. There is  no left ventricular hypertrophy. Left ventricular diastolic parameters are consistent with Grade I diastolic dysfunction (impaired relaxation). Right Ventricle: The right ventricular size is normal. No increase in right ventricular wall thickness. Right ventricular systolic function is normal. There is normal pulmonary artery systolic pressure. The tricuspid  regurgitant velocity is 2.42 m/s, and  with an assumed right atrial pressure of 3 mmHg, the estimated right ventricular systolic pressure is 26.4 mmHg. Left Atrium: Left atrial size was normal in size. Right Atrium: Right atrial size was normal in size. Pericardium: There is no evidence of pericardial effusion. Mitral Valve: The mitral valve is normal in structure. No evidence of mitral valve regurgitation. MV peak gradient, 3.3 mmHg. The mean mitral valve gradient is 1.0 mmHg. Tricuspid Valve: The tricuspid valve is normal in structure. Tricuspid valve regurgitation is mild. Aortic Valve: The aortic valve is tricuspid. Aortic valve regurgitation is not visualized. Aortic valve mean gradient measures 2.0 mmHg. Aortic valve peak gradient measures 4.7 mmHg. Aortic valve area, by VTI measures 3.02 cm. Pulmonic Valve: The pulmonic valve was not well visualized. Pulmonic valve regurgitation is not visualized. Aorta: The aortic root is normal in size and structure. Venous: The inferior vena cava was not well visualized. IAS/Shunts: No atrial level shunt detected by color flow Doppler.  LEFT VENTRICLE PLAX 2D LVIDd:         4.30 cm   Diastology LVIDs:         3.20 cm   LV e' medial:    8.16 cm/s LV PW:         1.30 cm   LV E/e' medial:  8.5 LV IVS:        1.20 cm   LV e' lateral:   9.90 cm/s LVOT diam:     2.00 cm   LV E/e' lateral: 7.0 LV SV:         68 LV SV Index:   29 LVOT Area:     3.14 cm  RIGHT VENTRICLE RV Basal diam:  3.40 cm RV Mid diam:    3.20 cm RV S prime:     11.70 cm/s LEFT ATRIUM             Index        RIGHT ATRIUM           Index LA diam:        3.20 cm 1.37 cm/m   RA Area:     17.60 cm LA Vol (A2C):   33.1 ml 14.21 ml/m  RA Volume:   47.90 ml  20.56 ml/m LA Vol (A4C):   27.3 ml 11.72 ml/m LA Biplane Vol: 30.3 ml 13.01 ml/m  AORTIC VALVE                    PULMONIC VALVE AV Area (Vmax):  2.65 cm     PV Vmax:       0.96 m/s AV Area (Vmean):   2.72 cm     PV Peak grad:  3.7 mmHg AV Area (VTI):      3.02 cm AV Vmax:           108.00 cm/s AV Vmean:          68.000 cm/s AV VTI:            0.226 m AV Peak Grad:      4.7 mmHg AV Mean Grad:      2.0 mmHg LVOT Vmax:         91.00 cm/s LVOT Vmean:        58.900 cm/s LVOT VTI:          0.217 m LVOT/AV VTI ratio: 0.96  AORTA Ao Root diam: 3.50 cm MITRAL VALVE               TRICUSPID VALVE MV Area (PHT): 2.73 cm    TR Peak grad:   23.4 mmHg MV Area VTI:   2.40 cm    TR Vmax:        242.00 cm/s MV Peak grad:  3.3 mmHg MV Mean grad:  1.0 mmHg    SHUNTS MV Vmax:       0.91 m/s    Systemic VTI:  0.22 m MV Vmean:      44.6 cm/s   Systemic Diam: 2.00 cm MV Decel Time: 278 msec MV E velocity: 69.20 cm/s MV A velocity: 88.60 cm/s MV E/A ratio:  0.78 Debbe Odea MD Electronically signed by Debbe Odea MD Signature Date/Time: 02/15/2023/5:16:55 PM    Final     Medications: Scheduled:  [START ON 05/16/2023] cholecalciferol  2,000 Units Oral Daily   vitamin B-12  2,000 mcg Oral Daily   enoxaparin (LOVENOX) injection  40 mg Subcutaneous Q24H   Vitamin D (Ergocalciferol)  50,000 Units Oral Q7 days   Continuous:  methylPREDNISolone (SOLU-MEDROL) injection 1,000 mg (02/17/23 1601)    Assessment: 67 yo female with a PMHx of vertigo who presented to the ED on 4/27 for assessment of new-onset right hemichorea that began one week PTA and which had been worsening since that time. Empiric IV Solumedrol started yesterday (5/2) for possible autoimmune etiology.  - Exam today is unchanged relative to yesterday. Subjectively, however, the patient states that she feels slightly improved.  - Labs:  - UDS negative - HIV negative - CSF IgG index normal - ESR and CRP normal - Ribonucleic protein elevated at 1.5. Lupus panel otherwise negative. Significance of this finding is uncertain given that the remainder of the panel is negative.  - Vitamin D came back low at 10.26 ng/mL (moderate deficiency bordering on severe based on published cutoffs). The literature  documents rare cases of hyperkinetic movement disorders including hemichorea, in association with vitamin D deficiency. Will need supplementation.  - Vitamin B12 came back low at 146. The literature also documents a rare association between B12 deficiency and the development of chorea. Supplemental high-dose B12 at 2000 mcg po per day has been started.  - Ceroloplasmin level is normal.  - Serum copper level borderline low at 78. Not likely of clinical significance - Antiphospholipid antibody panel is negative.  - CSF VZV PCR is negative - MRI brain with and without contrast: No acute intracranial abnormality. Atrophy with mild chronic small vessel ischemic disease and a few scattered remote lacunar infarcts about the thalami and  left cerebellum. No acute abnormality.  - TTE: Left ventricular ejection fraction, by estimation, is 45 to 50%. The left ventricle has mildly decreased function and demonstrates global hypokinesis. Left ventricular diastolic parameters are consistent with Grade I diastolic dysfunction (impaired relaxation). Right ventricular systolic function is normal. The right ventricular size is normal. There is normal pulmonary artery systolic pressure. The mitral valve is normal in structure. No evidence of mitral valve regurgitation. The aortic valve is tricuspid. Aortic valve regurgitation is not visualized.  - Fluoro-guided LP performed 4/30: Glucose, WBC, protein normal. Clear and colorless CSF.  - DDx: - Most likely etiology now felt to be autoimmune - We have also suspected that the culprit may be the transvaginal estrogen she began one month ago. There are cases in the literature of postmenopausal women developing chorea after starting HRT.  - As noted above, vitamin D and B12 deficiencies are rare causes of chorea. Supplementation has been started.  - DDx also includes autoimmune or inflammatory processes with SLE now being lower on the DDx given only one component of the panel  being positive. Sjogrens syndrome essentially ruled out due to negative anti-Ro, anti-La and antinuclear antibody levels. Other inflammatory etiologies that are still significant components of the DDx include antiphospholipid syndrome and paraneoplastic syndrome.  - Wilson's disease essentially ruled out given her age and normal ceruloplasmin level    Recommendations:  - Transvaginal estrogen has been stopped. Continue off this medication and other estrogen compounds indefinitely.  - Ordered labs: - Oligoclonal bands pending.  - Serum paraneoplastic panel is pending.  - Continue vitamin B12 supplementation indefinitely.  - On 3 month loading dose regimen for vitamin D: 50,000 IU of vitamin D orally once weekly for 3 months. After completing loading dose regimen, continue with daily vitamin D supplementation at 2000 IU/day thereafter (ordered). - Due to hypotension and lightheadedness during PT Wednesday and Thursday, she is undergoing a work up for orthostatic hypotension.   - Although she has had no sore throat in the last 3 months, there is some chance of rheumatic fever, given chorea and carditis. Hospitalist has ordered ASO - Regarding possible copper supplementation, she does not have clinical findings of such including no myelopathic symptoms or findings on exam, is not anemic, is not hypothermic and does not have low white blood cell count. Although copper toxicity is rare, would hold off on supplementation and recheck levels in about 6 months. The patient can increase her copper level naturally through consumption of shellfish including oysters, as well as beans, nuts, whole grains and organ meats.  - On day 2/5 of IV Solumedrol at 1000 mg qd.     LOS: 4 days   @Electronically  signed: Dr. Caryl Pina 02/17/2023  9:54 AM

## 2023-02-17 NOTE — Progress Notes (Signed)
Progress Note   Patient: Felicia Dunn ZOX:[id number] DOB: 03-29-1956 DOA: 02/12/2023     4 DOS: the patient was seen and examined on 02/17/2023   Brief hospital course: 67 year old with history of vertigo comes to the ER day prior to admission with complaints of 1 week of right-sided hemichorea.  Recently started on transvaginal estrogen.  Initially in the ED CT head and MRI brain were negative but did show chronic cerebral atrophy.  Patient was sent home but continued to have symptoms therefore returned back to the ED.  Repeat MRI again remains unremarkable for acute pathology.   Psych component ruled out by psychiatry team.  CSF study showed normal cell count and protein.  Patient appeared to have a vitamin D and B12 deficiency.  Started on supplements. Patient also has normal ceruloplasmin level.    Principal Problem:   Hemichorea Active Problems:   Hyperlipidemia   Vitamin B12 deficiency   CKD (chronic kidney disease) stage 2, GFR 60-89 ml/min   Orthostatic hypotension   Lung nodule   Vitamin D deficiency   Nocturnal hypoxemia   Assessment and Plan: Hemichorea. Symptoms started suddenly about a week ago, did not have any fever or respiratory symptoms. Workup including  LP appear negative.  MRI brain with and without contrast showed small vascular disease, remote lacunar infract in bilateral thalami and left cerebellar. Patient has vitamin D and vitamin B12 deficiency, but unlikely is a source. Echocardiogram showed ejection fraction 45 to 50% without valvular disease. Troponin, CRP, sed rate all normal. Discussed with neurology, not able to rule out autoimmune process.  Neurology has started the high-dose of steroids.   Continue high-dose steroids, patient symptoms appear to be improving today.   Orthostatic hypotension. LV systolic dysfunction. Patient had another episode of dizziness with drop blood pressure while working with occupational therapy.   Continue monitor blood  pressure. Will also hold treatment for LV systolic dysfunction for now.  Nocturnal hypoxemia. Patient had hypoxemia last night, was placed on 2 L oxygen.  She does not have any respiratory symptoms, most likely this is due to sleep apnea.  Will continue to follow. COVID-negative.   Vitamin B12 deficiency without anemia. Vitamin D deficiency. Continue vitamin D and vitamin B12.   Hypokalemia. Hypomagnesemia. Resolved.   Lung nodule. Small lung nodule on CT chest, follow-up with PCP as outpatient.      Subjective:  Right-sided involuntary movements seem to be better today.  No short of breath or dizziness.  Physical Exam: Vitals:   02/16/23 1945 02/17/23 0136 02/17/23 0505 02/17/23 0731  BP: 124/65 129/73 135/69 134/84  Pulse: 84 81 76 81  Resp: 20 20 20 20   Temp: 98.1 F (36.7 C) 98 F (36.7 C) 97.7 F (36.5 C) 97.6 F (36.4 C)  TempSrc:      SpO2: 93% 95% 94% 97%  Weight:      Height:       General exam: Appears calm and comfortable  Respiratory system: Clear to auscultation. Respiratory effort normal. Cardiovascular system: S1 & S2 heard, RRR. No JVD, murmurs, rubs, gallops or clicks. No pedal edema. Gastrointestinal system: Abdomen is nondistended, soft and nontender. No organomegaly or masses felt. Normal bowel sounds heard. Central nervous system: Alert and oriented.  Involuntary movement of the limbs are improving. Extremities: Symmetric 5 x 5 power. Skin: No rashes, lesions or ulcers Psychiatry: Judgement and insight appear normal. Mood & affect appropriate.    Data Reviewed: {Tip this will not be part of the  note when signed- Document your independent interpretation of telemetry tracing, EKG, lab, Radiology test or any other diagnostic tests. Add any new diagnostic test ordered today. Lab results reviewed.  Family Communication: Husband updated at bedside.  Disposition: Status is: Inpatient Remains inpatient appropriate because: Severity of disease.   IV treatment.     Time spent: 35 minutes  Author: Marrion Coy, MD 02/17/2023 12:26 PM  For on call review [email or web address].

## 2023-02-18 DIAGNOSIS — I951 Orthostatic hypotension: Secondary | ICD-10-CM | POA: Diagnosis not present

## 2023-02-18 DIAGNOSIS — E663 Overweight: Secondary | ICD-10-CM | POA: Insufficient documentation

## 2023-02-18 DIAGNOSIS — E538 Deficiency of other specified B group vitamins: Secondary | ICD-10-CM | POA: Diagnosis not present

## 2023-02-18 DIAGNOSIS — G255 Other chorea: Secondary | ICD-10-CM | POA: Diagnosis not present

## 2023-02-18 LAB — MAGNESIUM: Magnesium: 2.4 mg/dL (ref 1.7–2.4)

## 2023-02-18 LAB — CBC
HCT: 36.9 % (ref 36.0–46.0)
Hemoglobin: 12.5 g/dL (ref 12.0–15.0)
MCH: 30.2 pg (ref 26.0–34.0)
MCHC: 33.9 g/dL (ref 30.0–36.0)
MCV: 89.1 fL (ref 80.0–100.0)
Platelets: 219 10*3/uL (ref 150–400)
RBC: 4.14 MIL/uL (ref 3.87–5.11)
RDW: 12.1 % (ref 11.5–15.5)
WBC: 11 10*3/uL — ABNORMAL HIGH (ref 4.0–10.5)
nRBC: 0 % (ref 0.0–0.2)

## 2023-02-18 LAB — BASIC METABOLIC PANEL
Anion gap: 7 (ref 5–15)
BUN: 21 mg/dL (ref 8–23)
CO2: 23 mmol/L (ref 22–32)
Calcium: 9.2 mg/dL (ref 8.9–10.3)
Chloride: 105 mmol/L (ref 98–111)
Creatinine, Ser: 0.97 mg/dL (ref 0.44–1.00)
GFR, Estimated: >60 mL/min (ref >60)
Glucose, Bld: 159 mg/dL — ABNORMAL HIGH (ref 70–99)
Potassium: 3.7 mmol/L (ref 3.5–5.1)
Sodium: 135 mmol/L (ref 135–145)

## 2023-02-18 LAB — GLUCOSE, RANDOM: Glucose, Bld: 186 mg/dL — ABNORMAL HIGH (ref 70–99)

## 2023-02-18 LAB — ANTISTREPTOLYSIN O TITER: ASO: <20 IU/mL (ref 0.0–200.0)

## 2023-02-18 MED ORDER — SENNOSIDES-DOCUSATE SODIUM 8.6-50 MG PO TABS
2.0000 | ORAL_TABLET | Freq: Two times a day (BID) | ORAL | Status: DC
  Administered 2023-02-18: 2 via ORAL
  Filled 2023-02-18 (×4): qty 2

## 2023-02-18 MED ORDER — QUETIAPINE FUMARATE 25 MG PO TABS
25.0000 mg | ORAL_TABLET | Freq: Every day | ORAL | Status: DC
  Administered 2023-02-18: 25 mg via ORAL
  Filled 2023-02-18: qty 1

## 2023-02-18 NOTE — Progress Notes (Signed)
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  Recently started on transvaginal estrogen.  Initially in the ED CT head and MRI brain were negative but did show chronic cerebral atrophy.  Patient was sent home but continued to have symptoms therefore returned back to the ED.  Repeat MRI again remains unremarkable for acute pathology.   Psych component ruled out by psychiatry team.  CSF study showed normal cell count and protein.  Patient appeared to have a vitamin D and B12 deficiency.  Started on supplements. Patient also has normal ceruloplasmin level.    Principal Problem:   Hemichorea Active Problems:   Hyperlipidemia   Vitamin B12 deficiency   CKD (chronic kidney disease) stage 2, GFR 60-89 ml/min   Orthostatic hypotension   Lung nodule   Vitamin D deficiency   Nocturnal hypoxemia   Hyponatremia   Assessment and Plan: Hemichorea. Symptoms started suddenly about a week ago, did not have any fever or respiratory symptoms. Workup including  LP appear negative.  MRI brain with and without contrast showed small vascular disease, remote lacunar infract in bilateral thalami and left cerebellar. Patient has vitamin D and vitamin B12 deficiency, but unlikely is a source. Echocardiogram showed ejection fraction 45 to 50% without valvular disease. Troponin, CRP, sed rate all normal. Discussed with neurology, not able to rule out autoimmune process.  Neurology has started the high-dose of steroids.   Continue high-dose steroids 3/5,  patient symptoms appear to be improving. Husband is brought to my attention that patient has been vaping for the last 5 years, this could be the source of her symptoms.  She is advised not to use it  again.  Orthostatic hypotension. LV systolic dysfunction. Patient had another episode of dizziness with drop blood pressure while working with occupational therapy.   Continue monitor blood pressure. Will also hold treatment for LV systolic dysfunction for now.   Nocturnal hypoxemia. Patient had hypoxemia last night, was placed on 2 L oxygen.  She does not have any respiratory symptoms, most likely this is due to sleep apnea.  Will continue to follow. COVID-negative.   Vitamin B12 deficiency without anemia. Vitamin D deficiency. Continue vitamin D and vitamin B12.   Hypokalemia. Hypomagnesemia. Resolved.   Lung nodule. Small lung nodule on CT chest, follow-up with PCP as outpatient.      Subjective:  Patient appears to be doing better with the involuntary arm movement, she no longer has any dizziness with walking, oxygen saturations was better at night.  Physical Exam: Vitals:   02/17/23 2046 02/18/23 0436 02/18/23 0436 02/18/23 0747  BP: 139/68 124/64  134/65  Pulse: 88 70 69 83  Resp: 18 18  16   Temp: 97.8 F (36.6 C) (!) 97.5 F (36.4 C)  97.6 F (36.4 C)  TempSrc:      SpO2: 95% 90% 92% 99%  Weight:      Height:       General exam: Appears calm and comfortable  Respiratory system: Clear to auscultation. Respiratory effort normal. Cardiovascular system: S1 & S2 heard, RRR. No JVD, murmurs, rubs, gallops or clicks. No pedal edema. Gastrointestinal system: Abdomen is nondistended, soft and nontender. No organomegaly or masses felt. Normal bowel sounds heard. Central nervous system: Alert  and oriented. No focal neurological deficits. Extremities: Symmetric 5 x 5 power. Skin: No rashes, lesions or ulcers Psychiatry: Judgement and insight appear normal. Mood & affect appropriate.    Data Reviewed:  Lab results reviewed.  Family Communication: Husband updated at the bedside.  Disposition: Status is: Inpatient Remains inpatient appropriate because: Severity  of disease, IV treatment.     Time spent: 35 minutes  Author: Marrion Coy, MD 02/18/2023 10:45 AM  For on call review [email or web address].

## 2023-02-18 NOTE — Plan of Care (Signed)
  Problem: Education: Goal: Knowledge of General Education information will improve Description: Including pain rating scale, medication(s)/side effects and non-pharmacologic comfort measures Outcome: Progressing   Problem: Health Behavior/Discharge Planning: Goal: Ability to manage health-related needs will improve Outcome: Progressing   Problem: Clinical Measurements: Goal: Ability to maintain clinical measurements within normal limits will improve Outcome: Progressing Goal: Diagnostic test results will improve Outcome: Progressing Goal: Respiratory complications will improve Outcome: Progressing   Problem: Activity: Goal: Risk for activity intolerance will decrease Outcome: Progressing   Problem: Nutrition: Goal: Adequate nutrition will be maintained Outcome: Progressing   Problem: Coping: Goal: Level of anxiety will decrease Outcome: Progressing   Problem: Elimination: Goal: Will not experience complications related to bowel motility Outcome: Progressing Goal: Will not experience complications related to urinary retention Outcome: Progressing   Problem: Pain Managment: Goal: General experience of comfort will improve Outcome: Progressing   Problem: Safety: Goal: Ability to remain free from injury will improve Outcome: Progressing   Problem: Skin Integrity: Goal: Risk for impaired skin integrity will decrease Outcome: Progressing   

## 2023-02-18 NOTE — Progress Notes (Signed)
There is the consult for PIV access, but patient is getting solumedrol daily and she received today dose. Patient requested to have a PIV access tomorrow. Patient is only have prn medications and no cardiac monitoring. Talked patient's RN regarding this matter. Robyn RN was fine without a PIV access today. Informed Robyn RN that put in the consult if needs a PIV access. HS [employer]

## 2023-02-18 NOTE — Progress Notes (Signed)
Subjective: The patient states that her RUE chorea is improving. She is now able to perform fine motor tasks such as using her iPhone with her right hand, but still has chorea occur at times, especially when she is not volitionally using her right arm.   Objective: Current vital signs: BP 134/65 (BP Location: Left Arm)   Pulse 83   Temp 97.6 F (36.4 C)   Resp 16   Ht 6\' 5"  (1.956 m)   Wt 99.8 kg   SpO2 99%   BMI 26.09 kg/m  Vital signs in last 24 hours: Temp:  [97.5 F (36.4 C)-97.8 F (36.6 C)] 97.6 F (36.4 C) (05/04 0747) Pulse Rate:  [69-88] 83 (05/04 0747) Resp:  [16-18] 16 (05/04 0747) BP: (124-139)/(64-68) 134/65 (05/04 0747) SpO2:  [90 %-99 %] 99 % (05/04 0747)  Intake/Output from previous day: 05/03 0701 - 05/04 0700 In: 240 [P.O.:240] Out: -  Intake/Output this shift: No intake/output data recorded. Nutritional status:  Diet Order             Diet regular Room service appropriate? Yes; Fluid consistency: Thin  Diet effective now                   Physical Exam HEENT- [place]/AT   Lungs- Respirations unlabored Extremities- No cyanosis or pallor.    Neurological Examination Mental Status: Awake and alert. Fully oriented. Thought content appropriate. Speech fluent without evidence of aphasia.  Able to follow all commands without difficulty. Cranial Nerves: II, III,IV, VI: No ptosis. EOMI. No nystagmus. Fixates and tracks normally.  VII: Smile symmetric. Intermittent choreiform movements of right side of face are noted, similar in amplitude and frequency compared to yesterday.  VIII: Hearing intact to voice IX,X: No hypophonia or hoarseness XI: Symmetric XII: No lingual dysarthria.  Motor: Intermittent RUE moderate amplitude chorea that is present more prominently when she attempts to rest her arm, and nearly ceases while she is using her right hand for fine motor activity. RLE chorea occurs constantly throughout the exam.    Cerebellar: No ataxia  disproportionate to her chorea on the right.  Gait: Deferred  Lab Results: Results for orders placed or performed during the hospital encounter of 02/12/23 (from the past 48 hour(s))  Urinalysis, Routine w reflex microscopic -Urine, Clean Catch     Status: Abnormal   Collection Time: 02/16/23  5:15 PM  Result Value Ref Range   Color, Urine STRAW (A) YELLOW   APPearance HAZY (A) CLEAR   Specific Gravity, Urine 1.004 (L) 1.005 - 1.030   pH 6.0 5.0 - 8.0   Glucose, UA NEGATIVE NEGATIVE mg/dL   Hgb urine dipstick NEGATIVE NEGATIVE   Bilirubin Urine NEGATIVE NEGATIVE   Ketones, ur NEGATIVE NEGATIVE mg/dL   Protein, ur NEGATIVE NEGATIVE mg/dL   Nitrite NEGATIVE NEGATIVE   Leukocytes,Ua MODERATE (A) NEGATIVE   RBC / HPF 0-5 0 - 5 RBC/hpf   WBC, UA 0-5 0 - 5 WBC/hpf   Bacteria, UA RARE (A) NONE SEEN   Squamous Epithelial / HPF 6-10 0 - 5 /HPF   Mucus PRESENT    Amorphous Crystal PRESENT     Comment: Performed at [facility], [address], [place], [place] [address]  Basic metabolic panel     Status: Abnormal   Collection Time: 02/17/23  5:45 AM  Result Value Ref Range   Sodium 134 (L) 135 - 145 mmol/L   Potassium 3.7 3.5 - 5.1 mmol/L   Chloride 104 98 -  111 mmol/L   CO2 22 22 - 32 mmol/L   Glucose, Bld 163 (H) 70 - 99 mg/dL    Comment: Glucose reference range applies only to samples taken after fasting for at least 8 hours.   BUN 13 8 - 23 mg/dL   Creatinine, Ser 6.96 0.44 - 1.00 mg/dL   Calcium 9.4 8.9 - 29.5 mg/dL   GFR, Estimated >28 >41 mL/min    Comment: (NOTE) Calculated using the CKD-EPI Creatinine Equation (2021)    Anion gap 8 5 - 15    Comment: Performed at [facility], [address] [place], [place] [address]  CBC     Status: None   Collection Time: 02/17/23  5:45 AM  Result Value Ref Range   WBC 4.7 4.0 - 10.5 K/uL   RBC 4.59 3.87 - 5.11 MIL/uL   Hemoglobin 13.9 12.0 - 15.0 g/dL   HCT 10.2 72.5 - 36.6 %   MCV 86.9 80.0 - 100.0 fL   MCH  30.3 26.0 - 34.0 pg   MCHC 34.8 30.0 - 36.0 g/dL   RDW 44.0 34.7 - 42.5 %   Platelets 221 150 - 400 K/uL   nRBC 0.0 0.0 - 0.2 %    Comment: Performed at [facility], [address], [place], [place] [address]  Magnesium     Status: None   Collection Time: 02/17/23  5:45 AM  Result Value Ref Range   Magnesium 2.1 1.7 - 2.4 mg/dL    Comment: Performed at [facility], [address] [place], [place] [address]  SARS Coronavirus 2 by RT PCR (hospital order, performed in [facility] hospital lab) *cepheid single result test* Anterior Nasal Swab     Status: None   Collection Time: 02/17/23  9:12 AM   Specimen: Anterior Nasal Swab  Result Value Ref Range   SARS Coronavirus 2 by RT PCR NEGATIVE NEGATIVE    Comment: (NOTE) SARS-CoV-2 target nucleic acids are NOT DETECTED.  The SARS-CoV-2 RNA is generally detectable in upper and lower respiratory specimens during the acute phase of infection. The lowest concentration of SARS-CoV-2 viral copies this assay can detect is 250 copies / mL. A negative result does not preclude SARS-CoV-2 infection and should not be used as the sole basis for treatment or other patient management decisions.  A negative result may occur with improper specimen collection / handling, submission of specimen other than nasopharyngeal swab, presence of viral mutation(s) within the areas targeted by this assay, and inadequate number of viral copies (<250 copies / mL). A negative result must be combined with clinical observations, patient history, and epidemiological information.  Fact Sheet for Patients:   RoadLapTop.co.za  Fact Sheet for Healthcare Providers: http://kim-miller.com/  This test is not yet approved or  cleared by the [place] FDA and has been authorized for detection and/or diagnosis of SARS-CoV-2 by FDA under an Emergency Use Authorization (EUA).  This EUA will remain in effect  (meaning this test can be used) for the duration of the COVID-19 declaration under Section 564(b)(1) of the Act, 21 U.S.C. section 360bbb-3(b)(1), unless the authorization is terminated or revoked sooner.  Performed at [facility], [address] [place], [place] [address]   Basic metabolic panel     Status: Abnormal   Collection Time: 02/18/23  5:03 AM  Result Value Ref Range   Sodium 135 135 - 145 mmol/L   Potassium 3.7 3.5 - 5.1 mmol/L   Chloride 105 98 - 111  mmol/L   CO2 23 22 - 32 mmol/L   Glucose, Bld 159 (H) 70 - 99 mg/dL    Comment: Glucose reference range applies only to samples taken after fasting for at least 8 hours.   BUN 21 8 - 23 mg/dL   Creatinine, Ser 1.61 0.44 - 1.00 mg/dL   Calcium 9.2 8.9 - 09.6 mg/dL   GFR, Estimated >04 >54 mL/min    Comment: (NOTE) Calculated using the CKD-EPI Creatinine Equation (2021)    Anion gap 7 5 - 15    Comment: Performed at [facility], [address] [place], [place] [address]  CBC     Status: Abnormal   Collection Time: 02/18/23  5:03 AM  Result Value Ref Range   WBC 11.0 (H) 4.0 - 10.5 K/uL   RBC 4.14 3.87 - 5.11 MIL/uL   Hemoglobin 12.5 12.0 - 15.0 g/dL   HCT 91.4 78.2 - 95.6 %   MCV 89.1 80.0 - 100.0 fL   MCH 30.2 26.0 - 34.0 pg   MCHC 33.9 30.0 - 36.0 g/dL   RDW 21.3 08.6 - 57.8 %   Platelets 219 150 - 400 K/uL   nRBC 0.0 0.0 - 0.2 %    Comment: Performed at [facility], [address], [place], [place] [address]  Magnesium     Status: None   Collection Time: 02/18/23  5:03 AM  Result Value Ref Range   Magnesium 2.4 1.7 - 2.4 mg/dL    Comment: Performed at [facility], [address], [place], [place] [address]    Recent Results (from the past 240 hour(s))  VZV PCR, CSF     Status: None   Collection Time: 02/14/23 11:10 AM   Specimen: Lumbar Puncture; Cerebrospinal Fluid  Result Value Ref Range Status   VZV PCR, CSF Negative Negative Final    Comment: (NOTE) No  Varicella Zoster Virus DNA detected. Performed At: [facility] [address] [place], [place] [id number] Jolene Schimke MD VO:[id number]   SARS Coronavirus 2 by RT PCR (hospital order, performed in [facility] hospital lab) *cepheid single result test* Anterior Nasal Swab     Status: None   Collection Time: 02/17/23  9:12 AM   Specimen: Anterior Nasal Swab  Result Value Ref Range Status   SARS Coronavirus 2 by RT PCR NEGATIVE NEGATIVE Final    Comment: (NOTE) SARS-CoV-2 target nucleic acids are NOT DETECTED.  The SARS-CoV-2 RNA is generally detectable in upper and lower respiratory specimens during the acute phase of infection. The lowest concentration of SARS-CoV-2 viral copies this assay can detect is 250 copies / mL. A negative result does not preclude SARS-CoV-2 infection and should not be used as the sole basis for treatment or other patient management decisions.  A negative result may occur with improper specimen collection / handling, submission of specimen other than nasopharyngeal swab, presence of viral mutation(s) within the areas targeted by this assay, and inadequate number of viral copies (<250 copies / mL). A negative result must be combined with clinical observations, patient history, and epidemiological information.  Fact Sheet for Patients:   RoadLapTop.co.za  Fact Sheet for Healthcare Providers: http://kim-miller.com/  This test is not yet approved or  cleared by the [place] FDA and has been authorized for detection and/or diagnosis of SARS-CoV-2 by FDA under an Emergency Use Authorization (EUA).  This EUA will remain in effect (meaning this test can be used) for the duration of the COVID-19 declaration under Section  564(b)(1) of the Act, 21 U.S.C. section 360bbb-3(b)(1), unless the authorization is terminated or revoked sooner.  Performed at [facility], [address]  [place], [place] [address]     Lipid Panel No results for input(s): "CHOL", "TRIG", "HDL", "CHOLHDL", "VLDL", "LDLCALC" in the last 72 hours.  Studies/Results: No results found.  Medications: Prior to Admission:  Medications Prior to Admission  Medication Sig Dispense Refill Last Dose   Estradiol 10 MCG TABS vaginal tablet Place 1 tablet vaginally 2 (two) times a week.      meloxicam (MOBIC) 15 MG tablet Take 15 mg by mouth daily.      Scheduled:  [START ON 05/16/2023] cholecalciferol  2,000 Units Oral Daily   vitamin B-12  2,000 mcg Oral Daily   enoxaparin (LOVENOX) injection  40 mg Subcutaneous Q24H   senna-docusate  2 tablet Oral BID   Vitamin D (Ergocalciferol)  50,000 Units Oral Q7 days   Continuous:  methylPREDNISolone (SOLU-MEDROL) injection 1,000 mg (02/18/23 1914)    Assessment: 67 yo female with a PMHx of vertigo who presented to the ED on 4/27 for assessment of new-onset right hemichorea that began one week PTA and which had been worsening since that time. Empiric IV Solumedrol started yesterday (5/2) for possible autoimmune etiology.  - Exam today reveals mildly improved RUE chorea since yesterday. RLE with unchanged nearly constant choreiform movements.    - Labs:  - UDS negative - HIV negative - CSF IgG index normal - ESR and CRP normal - Ribonucleic protein elevated at 1.5. Lupus panel otherwise negative. Significance of this finding is uncertain given that the remainder of the panel is negative.  - Vitamin D came back low at 10.26 ng/mL (moderate deficiency bordering on severe based on published cutoffs). The literature documents rare cases of hyperkinetic movement disorders including hemichorea, in association with vitamin D deficiency. Will need supplementation.  - Vitamin B12 came back low at 146. The literature also documents a rare association between B12 deficiency and the development of chorea. Supplemental high-dose B12 at 2000 mcg po per day has been started.   - Ceroloplasmin level is normal.  - Serum copper level borderline low at 78. Not likely of clinical significance - Antiphospholipid antibody panel is negative.  - CSF VZV PCR is negative - MRI brain with and without contrast: No acute intracranial abnormality. Atrophy with mild chronic small vessel ischemic disease and a few scattered remote lacunar infarcts about the thalami and left cerebellum. No acute abnormality.  - TTE: Left ventricular ejection fraction, by estimation, is 45 to 50%. The left ventricle has mildly decreased function and demonstrates global hypokinesis. Left ventricular diastolic parameters are consistent with Grade I diastolic dysfunction (impaired relaxation). Right ventricular systolic function is normal. The right ventricular size is normal. There is normal pulmonary artery systolic pressure. The mitral valve is normal in structure. No evidence of mitral valve regurgitation. The aortic valve is tricuspid. Aortic valve regurgitation is not visualized.  - Fluoro-guided LP performed 4/30: Glucose, WBC, protein normal. Clear and colorless CSF.  - DDx: - Most likely etiology now felt to be autoimmune, but rare occurrence of hemichorea due to vitamin D or B12 deficiencies is also possible.  - We have also suspected that the culprit may be the transvaginal estrogen she began one month ago. There are cases in the literature of postmenopausal women developing chorea after starting HRT.  - As noted above, vitamin D and B12 deficiencies are rare causes of chorea. Supplementation has been  started.  - DDx also includes autoimmune or inflammatory processes with SLE now being lower on the DDx given only one component of the panel being positive. Sjogrens syndrome essentially ruled out due to negative anti-Ro, anti-La and antinuclear antibody levels. Other inflammatory etiologies that are still significant components of the DDx include antiphospholipid syndrome and paraneoplastic syndrome.  -  Wilson's disease essentially ruled out given her age and normal ceruloplasmin level    Recommendations:  - Transvaginal estrogen has been stopped. Continue off this medication and other estrogen compounds indefinitely.  - Ordered labs: - Oligoclonal bands pending.  - Serum paraneoplastic panel is pending.  - Continue vitamin B12 supplementation indefinitely.  - On 3 month loading dose regimen for vitamin D: 50,000 IU of vitamin D orally once weekly for 3 months. After completing loading dose regimen, continue with daily vitamin D supplementation at 2000 IU/day thereafter (ordered). - Due to hypotension and lightheadedness during PT Wednesday and Thursday, she is undergoing a work up for orthostatic hypotension.   - Although she has had no sore throat in the last 3 months, there is some chance of rheumatic fever, given chorea and carditis. Hospitalist has ordered ASO - Regarding possible copper supplementation, she does not have clinical findings of such including no myelopathic symptoms or findings on exam, is not anemic, is not hypothermic and does not have low white blood cell count. Although copper toxicity is rare, would hold off on supplementation and recheck levels in about 6 months. The patient can increase her copper level naturally through consumption of shellfish including oysters, as well as beans, nuts, whole grains and organ meats.  - On day 3/5 of IV Solumedrol at 1000 mg qd.     LOS: 5 days   @Electronically  signed: Dr. Caryl Pina 02/18/2023  11:54 AM

## 2023-02-19 ENCOUNTER — Inpatient Hospital Stay: Payer: Medicare HMO

## 2023-02-19 DIAGNOSIS — G255 Other chorea: Secondary | ICD-10-CM | POA: Diagnosis not present

## 2023-02-19 DIAGNOSIS — I951 Orthostatic hypotension: Secondary | ICD-10-CM | POA: Diagnosis not present

## 2023-02-19 LAB — GLUCOSE, CAPILLARY
Glucose-Capillary: 115 mg/dL — ABNORMAL HIGH (ref 70–99)
Glucose-Capillary: 149 mg/dL — ABNORMAL HIGH (ref 70–99)

## 2023-02-19 LAB — GLUCOSE, RANDOM: Glucose, Bld: 146 mg/dL — ABNORMAL HIGH (ref 70–99)

## 2023-02-19 MED ORDER — LORAZEPAM 2 MG/ML IJ SOLN: 2.0000 mg | Freq: Every evening | INTRAMUSCULAR | Status: DC | PRN

## 2023-02-19 MED ORDER — IOHEXOL 300 MG/ML  SOLN
100.0000 mL | Freq: Once | INTRAMUSCULAR | Status: AC | PRN
  Administered 2023-02-19: 100 mL via INTRAVENOUS

## 2023-02-19 NOTE — Progress Notes (Addendum)
Subjective: The patient states that her left arm involuntary movements are further improved this AM. Her RLE is also improved, but not as much. 4th bag of IV Solumedrol is infusing.   Objective: Current vital signs: BP (!) 146/74 (BP Location: Left Arm)   Pulse 70   Temp 97.9 F (36.6 C)   Resp 16   Ht 6\' 5"  (1.956 m)   Wt 99.8 kg   SpO2 91%   BMI 26.09 kg/m  Vital signs in last 24 hours: Temp:  [97.8 F (36.6 C)-98 F (36.7 C)] 97.9 F (36.6 C) (05/05 0758) Pulse Rate:  [66-77] 70 (05/05 0758) Resp:  [16-18] 16 (05/05 0758) BP: (113-146)/(63-80) 146/74 (05/05 0758) SpO2:  [91 %-94 %] 91 % (05/05 0758)  Intake/Output from previous day: 05/04 0701 - 05/05 0700 In: 50 [IV Piggyback:50] Out: -  Intake/Output this shift: No intake/output data recorded. Nutritional status:  Diet Order             Diet regular Room service appropriate? Yes; Fluid consistency: Thin  Diet effective now                   Physical Exam HEENT- [place]/AT   Lungs- Respirations unlabored Extremities- No cyanosis or pallor.    Neurological Examination Mental Status: Awake and alert. Thought content appropriate. Speech fluent without evidence of aphasia.  Able to follow all commands without difficulty. Cranial Nerves: II, III,IV, VI: No ptosis. EOMI. No nystagmus. Fixates and tracks normally.  VII: Smile symmetric. Minimal intermittent choreiform movements of right perioral region.  VIII: Hearing intact to voice IX,X: No hypophonia or hoarseness XI: Symmetric XII: No lingual dysarthria.  Motor: Intermittent RUE mild to moderate amplitude chorea that is present when she attempts to rest her arm, and ceases while she is using her right hand for fine motor activity. RLE chorea occurs  throughout most of the exam but is of lower average amplitude than yesterday.    Cerebellar: No ataxia noted.   Gait: Deferred  Lab Results: Results for orders placed or performed during the hospital encounter  of 02/12/23 (from the past 48 hour(s))  SARS Coronavirus 2 by RT PCR (hospital order, performed in [facility] hospital lab) *cepheid single result test* Anterior Nasal Swab     Status: None   Collection Time: 02/17/23  9:12 AM   Specimen: Anterior Nasal Swab  Result Value Ref Range   SARS Coronavirus 2 by RT PCR NEGATIVE NEGATIVE    Comment: (NOTE) SARS-CoV-2 target nucleic acids are NOT DETECTED.  The SARS-CoV-2 RNA is generally detectable in upper and lower respiratory specimens during the acute phase of infection. The lowest concentration of SARS-CoV-2 viral copies this assay can detect is 250 copies / mL. A negative result does not preclude SARS-CoV-2 infection and should not be used as the sole basis for treatment or other patient management decisions.  A negative result may occur with improper specimen collection / handling, submission of specimen other than nasopharyngeal swab, presence of viral mutation(s) within the areas targeted by this assay, and inadequate number of viral copies (<250 copies / mL). A negative result must be combined with clinical observations, patient history, and epidemiological information.  Fact Sheet for Patients:   RoadLapTop.co.za  Fact Sheet for Healthcare Providers: http://kim-miller.com/  This test is not yet approved or  cleared by the [place] FDA and has been authorized for detection and/or diagnosis of SARS-CoV-2 by FDA under an Emergency Use Authorization (EUA).  This EUA will  remain in effect (meaning this test can be used) for the duration of the COVID-19 declaration under Section 564(b)(1) of the Act, 21 U.S.C. section 360bbb-3(b)(1), unless the authorization is terminated or revoked sooner.  Performed at [facility], [address] [place], [place] [address]   Basic metabolic panel     Status: Abnormal   Collection Time: 02/18/23  5:03 AM  Result Value Ref Range    Sodium 135 135 - 145 mmol/L   Potassium 3.7 3.5 - 5.1 mmol/L   Chloride 105 98 - 111 mmol/L   CO2 23 22 - 32 mmol/L   Glucose, Bld 159 (H) 70 - 99 mg/dL    Comment: Glucose reference range applies only to samples taken after fasting for at least 8 hours.   BUN 21 8 - 23 mg/dL   Creatinine, Ser 6.04 0.44 - 1.00 mg/dL   Calcium 9.2 8.9 - 54.0 mg/dL   GFR, Estimated >98 >11 mL/min    Comment: (NOTE) Calculated using the CKD-EPI Creatinine Equation (2021)    Anion gap 7 5 - 15    Comment: Performed at [facility], [address] [place], [place] [address]  CBC     Status: Abnormal   Collection Time: 02/18/23  5:03 AM  Result Value Ref Range   WBC 11.0 (H) 4.0 - 10.5 K/uL   RBC 4.14 3.87 - 5.11 MIL/uL   Hemoglobin 12.5 12.0 - 15.0 g/dL   HCT 29.5 62.1 - 30.8 %   MCV 89.1 80.0 - 100.0 fL   MCH 30.2 26.0 - 34.0 pg   MCHC 33.9 30.0 - 36.0 g/dL   RDW 65.7 84.6 - 96.2 %   Platelets 219 150 - 400 K/uL   nRBC 0.0 0.0 - 0.2 %    Comment: Performed at [facility], [address] [place], [place] [address]  Magnesium     Status: None   Collection Time: 02/18/23  5:03 AM  Result Value Ref Range   Magnesium 2.4 1.7 - 2.4 mg/dL    Comment: Performed at [facility], [address] [place], [place] [address]  Glucose, random     Status: Abnormal   Collection Time: 02/18/23  4:53 PM  Result Value Ref Range   Glucose, Bld 186 (H) 70 - 99 mg/dL    Comment: Glucose reference range applies only to samples taken after fasting for at least 8 hours. Performed at [facility], [address] [place], [place] [address]   Glucose, random     Status: Abnormal   Collection Time: 02/19/23  4:36 AM  Result Value Ref Range   Glucose, Bld 146 (H) 70 - 99 mg/dL    Comment: Glucose reference range applies only to samples taken after fasting for at least 8 hours. Performed at [facility], [address] [place], [place] [address]   Glucose, capillary      Status: Abnormal   Collection Time: 02/19/23  8:01 AM  Result Value Ref Range   Glucose-Capillary 115 (H) 70 - 99 mg/dL    Comment: Glucose reference range applies only to samples taken after fasting for at least 8 hours.    Recent Results (from the past 240 hour(s))  VZV PCR, CSF     Status: None   Collection Time: 02/14/23 11:10 AM   Specimen: Lumbar Puncture; Cerebrospinal Fluid  Result Value Ref Range Status   VZV PCR, CSF Negative Negative Final    Comment: (NOTE) No Varicella Zoster  Virus DNA detected. Performed At: [facility] [address] [place], [place] [id number] Jolene Schimke MD [id number]   SARS Coronavirus 2 by RT PCR (hospital order, performed in [facility] hospital lab) *cepheid single result test* Anterior Nasal Swab     Status: None   Collection Time: 02/17/23  9:12 AM   Specimen: Anterior Nasal Swab  Result Value Ref Range Status   SARS Coronavirus 2 by RT PCR NEGATIVE NEGATIVE Final    Comment: (NOTE) SARS-CoV-2 target nucleic acids are NOT DETECTED.  The SARS-CoV-2 RNA is generally detectable in upper and lower respiratory specimens during the acute phase of infection. The lowest concentration of SARS-CoV-2 viral copies this assay can detect is 250 copies / mL. A negative result does not preclude SARS-CoV-2 infection and should not be used as the sole basis for treatment or other patient management decisions.  A negative result may occur with improper specimen collection / handling, submission of specimen other than nasopharyngeal swab, presence of viral mutation(s) within the areas targeted by this assay, and inadequate number of viral copies (<250 copies / mL). A negative result must be combined with clinical observations, patient history, and epidemiological information.  Fact Sheet for Patients:   RoadLapTop.co.za  Fact Sheet for Healthcare Providers: http://kim-miller.com/  This test  is not yet approved or  cleared by the [place] FDA and has been authorized for detection and/or diagnosis of SARS-CoV-2 by FDA under an Emergency Use Authorization (EUA).  This EUA will remain in effect (meaning this test can be used) for the duration of the COVID-19 declaration under Section 564(b)(1) of the Act, 21 U.S.C. section 360bbb-3(b)(1), unless the authorization is terminated or revoked sooner.  Performed at [facility], [address] [place], [place] [address]     Lipid Panel No results for input(s): "CHOL", "TRIG", "HDL", "CHOLHDL", "VLDL", "LDLCALC" in the last 72 hours.  Studies/Results: No results found.  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 Empiric IV Solumedrol started 5/2 for possible autoimmune etiology. Her hemichorea has gradually been improving while on pulsed-dose steroids. Today is day 4/5 of IV Solumedrol.  - Exam today reveals continuing improvement in her right hemichorea.   - Labs:  - UDS negative - HIV negative - ESR and CRP normal - Ribonucleic protein elevated at 1.5. Lupus panel otherwise negative. Significance of this finding is uncertain given that the remainder of the panel is negative.  - Vitamin D came back low at 10.26 ng/mL (moderate deficiency bordering on severe based on published cutoffs). The literature documents rare cases of hyperkinetic movement disorders including hemichorea, in  association with vitamin D deficiency. Will need supplementation.  - Vitamin B12 came back low at 146. The literature also documents a rare association between B12 deficiency and the development of chorea. Supplemental high-dose B12 at 2000 mcg po per day has been started.  - Ceroloplasmin level is normal.  - Serum copper  level borderline low at 78. Not likely of clinical significance - Antiphospholipid antibody panel is negative.  - MRI brain with and without contrast: No acute intracranial abnormality. Atrophy with mild chronic small vessel ischemic disease and a few scattered remote lacunar infarcts about the thalami and left cerebellum. No acute abnormality.  - CT chest: Small pulmonary nodules are unchanged and considered benign. - TTE: Left ventricular ejection fraction, by estimation, is 45 to 50%. The left ventricle has mildly decreased function and demonstrates global hypokinesis. Left ventricular diastolic parameters are consistent with Grade I diastolic dysfunction (impaired relaxation). Right ventricular systolic function is normal. The right ventricular size is normal. There is normal pulmonary artery systolic pressure. The mitral valve is normal in structure. No evidence of mitral valve regurgitation. The aortic valve is tricuspid. Aortic valve regurgitation is not visualized.  - Fluoro-guided LP performed 4/30: Clear and colorless CSF. Glucose, WBC and protein normal.  CSF VZV PCR is negative. CSF IgG index normal - DDx: - Most likely etiology now felt to be autoimmune, but rare occurrence of hemichorea due to vitamin D or B12 deficiencies is also possible.  - We have also suspected that the culprit may be the transvaginal estrogen she began one month ago. There are cases in the literature of postmenopausal women developing chorea after starting HRT.  - As noted above, vitamin D and B12 deficiencies are rare causes of chorea. Supplementation has been started.  - DDx also includes  autoimmune or inflammatory processes with SLE now being lower on the DDx given only one component of the panel being positive. Sjogrens syndrome essentially ruled out due to negative anti-Ro, anti-La and antinuclear antibody levels. Other inflammatory etiologies that are still significant components of the DDx include antiphospholipid syndrome and paraneoplastic syndrome.  - Wilson's disease essentially ruled out given her age and normal ceruloplasmin level    Recommendations:  - Ordered labs: - Oligoclonal bands pending.  - Serum paraneoplastic panel is pending.  - Continue vitamin B12 supplementation indefinitely.  - On 3 month loading dose regimen for vitamin D: 50,000 IU of vitamin D orally once weekly for 3 months. After completing loading dose regimen, continue with daily vitamin D supplementation at 2000 IU/day thereafter (ordered). - Transvaginal estrogen has been stopped. Continue off this medication and other estrogen compounds indefinitely.  - Due to hypotension and lightheadedness during PT Wednesday and Thursday, she is undergoing a work up for orthostatic hypotension.   - Although she has had no sore throat in the last 3 months, there is some chance of rheumatic fever, given chorea and carditis. Hospitalist has ordered ASO - Regarding possible copper supplementation, she does not have clinical findings of such including no myelopathic symptoms or findings on exam, is not anemic, is not hypothermic and does not have low white blood cell count. Although copper toxicity is rare, would hold off on supplementation and recheck levels in about 6 months. The patient can increase her copper level naturally through consumption of shellfish including oysters, as well as beans, nuts, whole grains and organ meats.  - The patient feels that seroquel did not help much for sleep and would like to be placed back on PRN Ativan. Orders have been placed to d/c seroquel and restart PRN qhs Ativan.  -  Obtaining CT of abdomen and pelvis to assess for possible tumor (DDx for her hemichorea includes paraneoplastic syndrome) - On day 4/5 of IV Solumedrol at 1000 mg qd. Tomorrow will be her last day of pulsed-dose  steroid treatment.      LOS: 6 days   @Electronically  signed: Dr. Caryl Pina 02/19/2023  8:28 AM

## 2023-02-19 NOTE — Progress Notes (Signed)
Progress Note   Patient: Felicia Dunn ZOX:[id number] DOB: 07-17-1956 DOA: 02/12/2023     6 DOS: the patient was seen and examined on 02/19/2023   Brief hospital course: 67 year old with history of vertigo comes to the ER day prior to admission with complaints of 1 week of right-sided hemichorea.  Recently started on transvaginal estrogen.  Initially in the ED CT head and MRI brain were negative but did show chronic cerebral atrophy.  Patient was sent home but continued to have symptoms therefore returned back to the ED.  Repeat MRI again remains unremarkable for acute pathology.   Psych component ruled out by psychiatry team.  CSF study showed normal cell count and protein.  Patient appeared to have a vitamin D and B12 deficiency.  Started on supplements. Patient also has normal ceruloplasmin level.    Principal Problem:   Hemichorea Active Problems:   Hyperlipidemia   Vitamin B12 deficiency   CKD (chronic kidney disease) stage 2, GFR 60-89 ml/min   Orthostatic hypotension   Lung nodule   Vitamin D deficiency   Nocturnal hypoxemia   Hyponatremia   Overweight (BMI 25.0-29.9)   Assessment and Plan: Hemichorea. Symptoms started suddenly about a week ago, did not have any fever or respiratory symptoms. Workup including  LP appear negative.  MRI brain with and without contrast showed small vascular disease, remote lacunar infract in bilateral thalami and left cerebellar. Patient has vitamin D and vitamin B12 deficiency, but unlikely is a source. Echocardiogram showed ejection fraction 45 to 50% without valvular disease. Troponin, CRP, sed rate all normal.  ASO also came back negative, ruled out rheumatic disease as a source. Discussed with neurology, not able to rule out autoimmune process.  Neurology has started the high-dose of steroids.   Husband is brought to my attention that patient has been vaping for the last 5 years, this could be the source of her symptoms.  She is advised not  to use it again. Patient is on day 4/5 of a high dose steroids, symptom gradually improving.  Consider discharge home tomorrow.  Orthostatic hypotension. LV systolic dysfunction. Patient had another episode of dizziness with drop blood pressure while working with occupational therapy.   Continue monitor blood pressure. Will also hold treatment for LV systolic dysfunction for now.   Nocturnal hypoxemia. Patient had hypoxemia last night, was placed on 2 L oxygen.  She does not have any respiratory symptoms, most likely this is due to sleep apnea.  Will continue to follow. COVID-negative.   Vitamin B12 deficiency without anemia. Vitamin D deficiency. Continue vitamin D and vitamin B12.   Hypokalemia. Hypomagnesemia. Resolved.   Lung nodule. Small lung nodule on CT chest, follow-up with PCP as outpatient.        Subjective:  Patient feels improving gradually.  No longer orthostatic.  Physical Exam: Vitals:   02/18/23 1626 02/18/23 2144 02/19/23 0526 02/19/23 0758  BP: 113/80 123/64 122/63 (!) 146/74  Pulse: 77 74 66 70  Resp: 18 18 16 16   Temp: 97.8 F (36.6 C) 97.8 F (36.6 C) 98 F (36.7 C) 97.9 F (36.6 C)  TempSrc:  Oral Oral   SpO2: 94% 93% 93% 91%  Weight:      Height:       General exam: Appears calm and comfortable  Respiratory system: Clear to auscultation. Respiratory effort normal. Cardiovascular system: S1 & S2 heard, RRR. No JVD, murmurs, rubs, gallops or clicks. No pedal edema. Gastrointestinal system: Abdomen is nondistended, soft and nontender.  No organomegaly or masses felt. Normal bowel sounds heard. Central nervous system: Alert and oriented. No focal neurological deficits. Extremities: Symmetric 5 x 5 power. Skin: No rashes, lesions or ulcers Psychiatry: Judgement and insight appear normal. Mood & affect appropriate.    Data Reviewed:  Lab results reviewed.  Family Communication: Sons updated.  Disposition: Status is: Inpatient Remains  inpatient appropriate because: Severity of disease, IV treatment.     Time spent: 35 minutes  Author: Marrion Coy, MD 02/19/2023 10:23 AM  For on call review [email or web address].

## 2023-02-20 DIAGNOSIS — G4734 Idiopathic sleep related nonobstructive alveolar hypoventilation: Secondary | ICD-10-CM | POA: Diagnosis not present

## 2023-02-20 DIAGNOSIS — E538 Deficiency of other specified B group vitamins: Secondary | ICD-10-CM | POA: Diagnosis not present

## 2023-02-20 DIAGNOSIS — G255 Other chorea: Secondary | ICD-10-CM | POA: Diagnosis not present

## 2023-02-20 DIAGNOSIS — I951 Orthostatic hypotension: Secondary | ICD-10-CM | POA: Diagnosis not present

## 2023-02-20 MED ORDER — VITAMIN D (ERGOCALCIFEROL) 1.25 MG (50000 UNIT) PO CAPS: 50000.0000 [IU] | ORAL_CAPSULE | ORAL | 0 refills | Status: AC

## 2023-02-20 MED ORDER — CYANOCOBALAMIN 2000 MCG PO TABS: 1000.0000 ug | ORAL_TABLET | Freq: Every day | ORAL | 0 refills | Status: AC

## 2023-02-20 MED ORDER — VITAMIN D3 25 MCG PO TABS: 2000.0000 [IU] | ORAL_TABLET | Freq: Every day | ORAL | 0 refills | Status: AC

## 2023-02-20 NOTE — TOC Transition Note (Addendum)
Transition of Care [facility]) - CM/SW Discharge Note   Patient Details  Name: Felicia Dunn MRN: [id number] Date of Birth: 03/16/1956  Transition of Care [facility]) CM/SW Contact:  Allena Katz, LCSW Phone Number: 02/20/2023, 9:59 AM   Clinical Narrative:   Pt has orders to discharge home. Pt now requesting a RW and 3IN1. RW ordered through [employer] to be delivered to her room. Outpatient PT/OT referral faxed to [facility] outpatient per patient request.   10:14am Pt now requesting BSC be canceled.TOC has canceled this        Patient Goals and CMS Choice      Discharge Placement                         Discharge Plan and Services Additional resources added to the After Visit Summary for                                       Social Determinants of Health (SDOH) Interventions SDOH Screenings   Food Insecurity: No Food Insecurity (02/13/2023)  Housing: Low Risk  (02/13/2023)  Transportation Needs: No Transportation Needs (02/13/2023)  Utilities: Not At Risk (02/13/2023)  Tobacco Use: Medium Risk (02/13/2023)     Readmission Risk Interventions     No data to display

## 2023-02-20 NOTE — Progress Notes (Signed)
Discharge instructions reviewed with patient including followup visits and new medications.  Understanding was verbalized and all questions were answered.  IV removed without complication; patient tolerated well.  Awaiting delivery of DME prior to discharge.

## 2023-02-20 NOTE — Discharge Summary (Addendum)
Physician Discharge Summary   Patient: Felicia Dunn MRN: [id number] DOB: [birth year]  Admit date:     02/12/2023  Discharge date: 02/20/23  Discharge Physician: Marrion Coy   PCP: Mick Sell, MD   Recommendations at discharge:   Follow-up with PCP in 1 week. Follow-up with neurology in 1 month. Follow-up with Dr. Mariah Milling in 2 weeks. 4.   Oligoclonal bands pending.  Please follow-up with the results. 5.   Serum paraneoplastic panel is pending, follow-up with results. 6.   Patient has a 4 mm lung nodule, please follow-up with PCP.  Discharge Diagnoses: Principal Problem:   Hemichorea Active Problems:   Hyperlipidemia   Vitamin B12 deficiency   CKD (chronic kidney disease) stage 2, GFR 60-89 ml/min   Orthostatic hypotension   Lung nodule   Vitamin D deficiency   Nocturnal hypoxemia   Hyponatremia   Overweight (BMI 25.0-29.9)  Resolved Problems:   Hypomagnesemia   Hypokalemia  Hospital Course: 67 year old with history of vertigo comes to the ER day prior to admission with complaints of 1 week of right-sided hemichorea.  Recently started on transvaginal estrogen.  Initially in the ED CT head and MRI brain were negative but did show chronic cerebral atrophy.  Patient was sent home but continued to have symptoms therefore returned back to the ED.  Repeat MRI again remains unremarkable for acute pathology.   Psych component ruled out by psychiatry team.  CSF study showed normal cell count and protein.  Patient appeared to have a vitamin D and B12 deficiency.  Started on supplements. Patient also has normal ceruloplasmin level.   Assessment and Plan:  Hemichorea. Symptoms started suddenly about a week ago, did not have any fever or respiratory symptoms. Workup including  LP appear negative.  MRI brain with and without contrast showed small vascular disease, remote lacunar infract in bilateral thalami and left cerebellar. Patient has vitamin D and vitamin B12 deficiency,  but unlikely is a source. Echocardiogram showed ejection fraction 45 to 50% without valvular disease. Troponin, CRP, sed rate all normal.  ASO also came back negative, ruled out rheumatic disease as a source. Discussed with neurology, not able to rule out autoimmune process.  Neurology has started the high-dose of steroids.   Husband is brought to my attention that patient has been vaping for the last 5 years, this could be the source of her symptoms.  She is advised not to use it again. Patient also advised to discontinue estrogen treatment. Patient so far has completed 5 days of high-dose steroids, discussed with Dr. Otelia Limes, no additional steroids taper is needed.  Patient will be followed by neurology and PCP as outpatient.   Orthostatic hypotension. LV systolic dysfunction. Patient had another episode of dizziness with drop blood pressure while working with occupational therapy.   Will refer patient to cardiology as outpatient.  Currently, patient does not have any evidence of exacerbation of congestive heart failure.  Orthostatic hypotension has resolved since.    Nocturnal hypoxemia. Patient had hypoxemia last night, was placed on 2 L oxygen.  She does not have any respiratory symptoms, most likely this is due to sleep apnea.  COVID-negative. Patient has no additional hypoxemia in the nighttime for the last 2 nights.  Vitamin B12 deficiency without anemia. Vitamin D deficiency. Continue vitamin D and vitamin B12.   Hypokalemia. Hypomagnesemia. Resolved.   Lung nodule. Small lung nodule on CT chest, follow-up with PCP as outpatient.        Consultants: Neurology Procedures  performed: LP  Disposition: Home Diet recommendation:  Discharge Diet Orders (From admission, onward)     Start     Ordered   02/20/23 0000  Diet - low sodium heart healthy        02/20/23 0950           Cardiac diet DISCHARGE MEDICATION: Allergies as of 02/20/2023   No Known Allergies       Medication List     STOP taking these medications    Estradiol 10 MCG Tabs vaginal tablet       TAKE these medications    cyanocobalamin 2000 MCG tablet Take 0.5 tablets (1,000 mcg total) by mouth daily. Start taking on: Feb 21, 2023   meloxicam 15 MG tablet Commonly known as: MOBIC Take 15 mg by mouth daily.   Vitamin D (Ergocalciferol) 1.25 MG (50000 UNIT) Caps capsule Commonly known as: DRISDOL Take 1 capsule (50,000 Units total) by mouth every 7 (seven) days. Start taking on: Feb 21, 2023   vitamin D3 25 MCG tablet Commonly known as: CHOLECALCIFEROL Take 2 tablets (2,000 Units total) by mouth daily. Start taking on: May 16, 2023        Follow-up Information     Mick Sell, MD Follow up in 1 week(s).   Specialty: Infectious Diseases Contact information: [address] [place] [address] [phone]         [facility] Follow up in 1 month(s).   Contact information: [address] [place] [address] [phone]               Discharge Exam: Ceasar Mons Weights   02/12/23 1453  Weight: 99.8 kg   General exam: Appears calm and comfortable  Respiratory system: Clear to auscultation. Respiratory effort normal. Cardiovascular system: S1 & S2 heard, RRR. No JVD, murmurs, rubs, gallops or clicks. No pedal edema. Gastrointestinal system: Abdomen is nondistended, soft and nontender. No organomegaly or masses felt. Normal bowel sounds heard. Central nervous system: Alert and oriented. No focal neurological deficits. Extremities: Symmetric 5 x 5 power. Skin: No rashes, lesions or ulcers Psychiatry: Judgement and insight appear normal. Mood & affect appropriate.    Condition at discharge: good  The results of significant diagnostics from this hospitalization (including imaging, microbiology, ancillary and laboratory) are listed below for reference.   Imaging Studies: CT ABDOMEN  PELVIS W CONTRAST  Result Date: 02/19/2023 CLINICAL DATA:  Occult malignancy; * Tracking Code: BO * EXAM: CT ABDOMEN AND PELVIS WITH CONTRAST TECHNIQUE: Multidetector CT imaging of the abdomen and pelvis was performed using the standard protocol following bolus administration of intravenous contrast. RADIATION DOSE REDUCTION: This exam was performed according to the departmental dose-optimization program which includes automated exposure control, adjustment of the mA and/or kV according to patient size and/or use of iterative reconstruction technique. CONTRAST:  OMNIPAQUE IOHEXOL 300 MG/ML  SOLN COMPARISON:  CT abdomen and pelvis dated December 15, 2009 FINDINGS: Lower chest: Small right lower lobe pulmonary nodule measuring 4 mm on series 4, image 11, unchanged in size when compared with 2011 prior and considered benign given multi year stability. Small hiatal hernia. Hepatobiliary: No suspicious focal liver abnormality is seen. Gallbladder is decompressed. No biliary ductal dilation. Pancreas: Unremarkable. No pancreatic ductal dilatation or surrounding inflammatory changes. Spleen: Normal in size without focal abnormality. Adrenals/Urinary Tract: Bilateral adrenal glands are unremarkable. No hydronephrosis or nephrolithiasis. Bladder is unremarkable. Stomach/Bowel: Stomach is within normal limits. No evidence of bowel wall thickening,  distention, or inflammatory changes. Vascular/Lymphatic: Severe aortic atherosclerosis multifocal areas of moderate luminal narrowing. No enlarged abdominal or pelvic lymph nodes. Reproductive: Uterus and bilateral adnexa are unremarkable. Other: No abdominal wall hernia or abnormality. No abdominopelvic ascites. Musculoskeletal: No acute or significant osseous findings. IMPRESSION: 1. No evidence of  malignancy in the abdomen or pelvis. 2. Small right lower lobe pulmonary nodule measuring 4 mm, unchanged in size when compared with 2011 prior and considered benign given multi  year stability. 3. Severe aortic Atherosclerosis (ICD10-I70.0) with multifocal areas of moderate luminal narrowing. Electronically Signed   By: Allegra Lai M.D.   On: 02/19/2023 21:31   ECHOCARDIOGRAM COMPLETE  Result Date: 02/15/2023    ECHOCARDIOGRAM REPORT   Patient Name:   Felicia Dunn Date of Exam: 02/15/2023 Medical Rec #:  [id number]       Height:       77.0 in Accession #:    [id number]      Weight:       220.0 lb Date of Birth:  1956/05/22       BSA:          2.329 m Patient Age:    53 years        BP:           128/69 mmHg Patient Gender: F               HR:           75 bpm. Exam Location:  [facility] Procedure: 2D Echo, Cardiac Doppler and Color Doppler Indications:     Dilated cardiomyopathy  History:         Patient has no prior history of Echocardiogram examinations.                  Cardiomyopathy; Risk Factors:Dyslipidemia.  Sonographer:     Mikki Harbor Referring Phys:  [id number] Marrion Coy Diagnosing Phys: Debbe Odea MD IMPRESSIONS  1. Left ventricular ejection fraction, by estimation, is 45 to 50%. The left ventricle has mildly decreased function. The left ventricle demonstrates global hypokinesis. Left ventricular diastolic parameters are consistent with Grade I diastolic dysfunction (impaired relaxation).  2. Right ventricular systolic function is normal. The right ventricular size is normal. There is normal pulmonary artery systolic pressure.  3. The mitral valve is normal in structure. No evidence of mitral valve regurgitation.  4. The aortic valve is tricuspid. Aortic valve regurgitation is not visualized. FINDINGS  Left Ventricle: Left ventricular ejection fraction, by estimation, is 45 to 50%. The left ventricle has mildly decreased function. The left ventricle demonstrates global hypokinesis. The left ventricular internal cavity size was normal in size. There is  no left ventricular hypertrophy. Left ventricular diastolic parameters are consistent with Grade I diastolic  dysfunction (impaired relaxation). Right Ventricle: The right ventricular size is normal. No increase in right ventricular wall thickness. Right ventricular systolic function is normal. There is normal pulmonary artery systolic pressure. The tricuspid regurgitant velocity is 2.42 m/s, and  with an assumed right atrial pressure of 3 mmHg, the estimated right ventricular systolic pressure is 26.4 mmHg. Left Atrium: Left atrial size was normal in size. Right Atrium: Right atrial size was normal in size. Pericardium: There is no evidence of pericardial effusion. Mitral Valve: The mitral valve is normal in structure. No evidence of mitral valve regurgitation. MV peak gradient, 3.3 mmHg. The mean mitral valve gradient is 1.0 mmHg. Tricuspid Valve: The tricuspid valve is normal in structure. Tricuspid valve regurgitation is mild.  Aortic Valve: The aortic valve is tricuspid. Aortic valve regurgitation is not visualized. Aortic valve mean gradient measures 2.0 mmHg. Aortic valve peak gradient measures 4.7 mmHg. Aortic valve area, by VTI measures 3.02 cm. Pulmonic Valve: The pulmonic valve was not well visualized. Pulmonic valve regurgitation is not visualized. Aorta: The aortic root is normal in size and structure. Venous: The inferior vena cava was not well visualized. IAS/Shunts: No atrial level shunt detected by color flow Doppler.  LEFT VENTRICLE PLAX 2D LVIDd:         4.30 cm   Diastology LVIDs:         3.20 cm   LV e' medial:    8.16 cm/s LV PW:         1.30 cm   LV E/e' medial:  8.5 LV IVS:        1.20 cm   LV e' lateral:   9.90 cm/s LVOT diam:     2.00 cm   LV E/e' lateral: 7.0 LV SV:         68 LV SV Index:   29 LVOT Area:     3.14 cm  RIGHT VENTRICLE RV Basal diam:  3.40 cm RV Mid diam:    3.20 cm RV S prime:     11.70 cm/s LEFT ATRIUM             Index        RIGHT ATRIUM           Index LA diam:        3.20 cm 1.37 cm/m   RA Area:     17.60 cm LA Vol (A2C):   33.1 ml 14.21 ml/m  RA Volume:   47.90 ml  20.56  ml/m LA Vol (A4C):   27.3 ml 11.72 ml/m LA Biplane Vol: 30.3 ml 13.01 ml/m  AORTIC VALVE                    PULMONIC VALVE AV Area (Vmax):    2.65 cm     PV Vmax:       0.96 m/s AV Area (Vmean):   2.72 cm     PV Peak grad:  3.7 mmHg AV Area (VTI):     3.02 cm AV Vmax:           108.00 cm/s AV Vmean:          68.000 cm/s AV VTI:            0.226 m AV Peak Grad:      4.7 mmHg AV Mean Grad:      2.0 mmHg LVOT Vmax:         91.00 cm/s LVOT Vmean:        58.900 cm/s LVOT VTI:          0.217 m LVOT/AV VTI ratio: 0.96  AORTA Ao Root diam: 3.50 cm MITRAL VALVE               TRICUSPID VALVE MV Area (PHT): 2.73 cm    TR Peak grad:   23.4 mmHg MV Area VTI:   2.40 cm    TR Vmax:        242.00 cm/s MV Peak grad:  3.3 mmHg MV Mean grad:  1.0 mmHg    SHUNTS MV Vmax:       0.91 m/s    Systemic VTI:  0.22 m MV Vmean:      44.6 cm/s   Systemic Diam: 2.00  cm MV Decel Time: 278 msec MV E velocity: 69.20 cm/s MV A velocity: 88.60 cm/s MV E/A ratio:  0.78 Debbe Odea MD Electronically signed by Debbe Odea MD Signature Date/Time: 02/15/2023/5:16:55 PM    Final    DG FL GUIDED LUMBAR PUNCTURE  Result Date: 02/14/2023 CLINICAL DATA:  Patient with right-sided hemichorea. Diagnostic lumbar puncture requested. EXAM: DIAGNOSTIC LUMBAR PUNCTURE UNDER FLUOROSCOPIC GUIDANCE COMPARISON:  None Available. FLUOROSCOPY: Radiation Exposure Index (as provided by the fluoroscopic device): 7.80 mGy Kerma PROCEDURE: Informed consent was obtained from the patient prior to the procedure, including potential complications of headache, allergy, and pain. With the patient prone, the lower back was prepped with Betadine. 1% Lidocaine was used for local anesthesia. Lumbar puncture was performed at the L3/L4 level using a 20 gauge needle with return of clear CSF with an opening pressure of 23 cm water. 13 ml of CSF were obtained for laboratory studies. The patient tolerated the procedure well and there were no apparent complications.  IMPRESSION: Technically successful L3-L4 lumbar puncture yielding 13 mL of clear CSF for laboratory studies. Procedure performed by Alwyn Ren, NP Electronically Signed   By: Simonne Come M.D.   On: 02/14/2023 12:24   MR Brain W and Wo Contrast  Result Date: 02/12/2023 CLINICAL DATA:  Initial evaluation for headache. Neuro deficit, hemi chorea. EXAM: MRI HEAD WITHOUT AND WITH CONTRAST TECHNIQUE: Multiplanar, multiecho pulse sequences of the brain and surrounding structures were obtained without and with intravenous contrast. CONTRAST:  10mL GADAVIST GADOBUTROL 1 MMOL/ML IV SOLN, 10mL GADAVIST GADOBUTROL 1 MMOL/ML IV SOLN COMPARISON:  Brain MRI from 1 day earlier on 02/11/2023. FINDINGS: Brain: Atrophy with mild chronic small vessel ischemic disease. Few scatter remote lacunar infarcts about the thalami and left cerebellum again noted. No evidence for acute or subacute ischemia. No areas of chronic cortical infarction. No acute or chronic intracranial blood products. No mass lesion, midline shift or mass effect. Mild ventricular prominence related to global parenchymal volume loss of hydrocephalus. No parenchymal changes of hemichorea/hemiballismus. Pituitary gland and suprasellar region within normal limits. No abnormal enhancement. Vascular: Major intracranial vascular flow voids are maintained. Skull and upper cervical spine: Craniocervical junction within normal limits. Bone marrow signal intensity normal. No scalp soft tissue abnormality. Sinuses/Orbits: Prior ocular lens replacement on the left. Paranasal sinuses are clear. No mastoid effusion. Other: None. IMPRESSION: 1. Stable brain MRI.  No acute intracranial abnormality. 2. Atrophy with mild chronic small vessel ischemic disease, with a few scattered remote lacunar infarcts about the thalami and left cerebellum. Electronically Signed   By: Rise Mu M.D.   On: 02/12/2023 19:45   CT CHEST WO CONTRAST  Result Date: 02/12/2023 CLINICAL  DATA:  Pulmonary nodules EXAM: CT CHEST WITHOUT CONTRAST TECHNIQUE: Multidetector CT imaging of the chest was performed following the standard protocol without IV contrast. RADIATION DOSE REDUCTION: This exam was performed according to the departmental dose-optimization program which includes automated exposure control, adjustment of the mA and/or kV according to patient size and/or use of iterative reconstruction technique. COMPARISON:  01/05/2021 FINDINGS: Cardiovascular: Coronary, aortic arch, and branch vessel atherosclerotic vascular disease. There is a focus of mitral valve calcification. Mediastinum/Nodes: Small type 1 hiatal hernia. Lungs/Pleura: Biapical pleuroparenchymal scarring. Cylindrical bronchiectasis with some airway plugging in the right middle lobe. Small pulmonary nodules are present. This includes a 5 by 4 mm right lower lobe pulmonary nodule on image 140 series 4 unchanged from 12/15/2009, and hence definitively benign. There is also a 5 by 3 by  2 mm (volume = 20 mm^3) left lower lobe pulmonary nodule on image 114 series 4 which is unchanged from 01/05/2021, compatible with benign etiology. No new or enlarging nodule identified. Upper Abdomen: Abdominal aortic atherosclerosis. Musculoskeletal: Mild thoracic spondylosis. IMPRESSION: 1. Small pulmonary nodules are unchanged and considered benign. These nodules do not require specific follow up. However, this does not necessarily mean that further screening screening assessments should be discontinued. 2. Cylindrical bronchiectasis with some airway plugging in the right middle lobe. 3. Small type 1 hiatal hernia. 4. Coronary, aortic arch, and branch vessel atherosclerotic vascular disease. Calcification of the mitral valve. 5. Aortic atherosclerosis. Aortic Atherosclerosis (ICD10-I70.0). Electronically Signed   By: Gaylyn Rong M.D.   On: 02/12/2023 18:53   MR Brain W and Wo Contrast  Result Date: 02/11/2023 CLINICAL DATA:  Initial  evaluation for headache, neuro deficit. EXAM: MRI HEAD WITHOUT AND WITH CONTRAST TECHNIQUE: Multiplanar, multiecho pulse sequences of the brain and surrounding structures were obtained without and with intravenous contrast. CONTRAST:  10mL GADAVIST GADOBUTROL 1 MMOL/ML IV SOLN COMPARISON:  CT from earlier the same day. FINDINGS: Brain: Diffuse prominence of the CSF containing spaces compatible generalized cerebral atrophy. Patchy T2/FLAIR hyperintensity involving the periventricular and deep white matter both cerebral hemispheres, consistent with chronic small vessel ischemic disease, mild in nature. Few scatter remote lacunar infarcts present about the thalami. Small remote left cerebellar infarct noted. No evidence for acute or subacute ischemia. Gray-white matter differentiation maintained. No areas of chronic cortical infarction. No acute or chronic intracranial blood products. No mass lesion, midline shift or mass effect. Mild ventricular prominence related to global parenchymal volume loss without hydrocephalus. No extra-axial fluid collection. Pituitary gland and suprasellar region within normal limits. Vascular: Major intracranial vascular flow voids are maintained. Skull and upper cervical spine: Craniocervical junction within normal limits. Bone marrow signal intensity normal. No scalp soft tissue abnormality. Sinuses/Orbits: Prior ocular lens replacement on the left. Paranasal sinuses are largely clear. No significant mastoid effusion. Other: None. IMPRESSION: 1. No acute intracranial abnormality. 2. 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 Electronically Signed   By: Rise Mu M.D.   On: 02/11/2023 19:15   CT Head Wo Contrast  Result Date: 02/11/2023 CLINICAL DATA:  Mental status change EXAM: CT HEAD WITHOUT CONTRAST TECHNIQUE: Contiguous axial images were obtained from  the base of the skull through the vertex without intravenous contrast. RADIATION DOSE REDUCTION: This exam was performed according to the departmental dose-optimization program which includes automated exposure control, adjustment of the mA and/or kV according to patient size and/or use of iterative reconstruction technique. COMPARISON:  None Available. FINDINGS: Brain: No subdural, epidural, or subarachnoid hemorrhage. No mass effect or midline shift. Cerebellum demonstrates a probable lacunar infarct in the left cerebellar hemisphere best seen image 54 and axial image 6. Cerebellum otherwise normal. Brainstem is normal. Basal cisterns are unremarkable. Ventricles and sulci are prominent but otherwise unremarkable. Mild white matter changes. No acute infarct or ischemia. Vascular: Calcified atherosclerotic changes in the intracranial carotids. Skull: Normal. Negative for fracture or focal lesion. Sinuses/Orbits: No acute finding. Other: No other abnormalities. IMPRESSION: 1. 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 2. Atrophy and white matter changes. 3. Calcified atherosclerotic changes in the intracranial carotids. Electronically Signed   By: Gerome Sam III M.D.   On: 02/11/2023 14:18    Microbiology: Results for orders placed or performed during the  hospital encounter of 02/12/23  VZV PCR, CSF     Status: None   Collection Time: 02/14/23 11:10 AM   Specimen: Lumbar Puncture; Cerebrospinal Fluid  Result Value Ref Range Status   VZV PCR, CSF Negative Negative Final    Comment: (NOTE) No Varicella Zoster Virus DNA detected. Performed At: [facility] [address] [place], [place] [id number] Jolene Schimke MD WG:[id number]   SARS Coronavirus 2 by RT PCR (hospital order, performed in [facility] hospital lab) *cepheid single result test* Anterior Nasal Swab     Status: None   Collection Time: 02/17/23  9:12 AM   Specimen: Anterior Nasal Swab  Result Value  Ref Range Status   SARS Coronavirus 2 by RT PCR NEGATIVE NEGATIVE Final    Comment: (NOTE) SARS-CoV-2 target nucleic acids are NOT DETECTED.  The SARS-CoV-2 RNA is generally detectable in upper and lower respiratory specimens during the acute phase of infection. The lowest concentration of SARS-CoV-2 viral copies this assay can detect is 250 copies / mL. A negative result does not preclude SARS-CoV-2 infection and should not be used as the sole basis for treatment or other patient management decisions.  A negative result may occur with improper specimen collection / handling, submission of specimen other than nasopharyngeal swab, presence of viral mutation(s) within the areas targeted by this assay, and inadequate number of viral copies (<250 copies / mL). A negative result must be combined with clinical observations, patient history, and epidemiological information.  Fact Sheet for Patients:   RoadLapTop.co.za  Fact Sheet for Healthcare Providers: http://kim-miller.com/  This test is not yet approved or  cleared by the [place] FDA and has been authorized for detection and/or diagnosis of SARS-CoV-2 by FDA under an Emergency Use Authorization (EUA).  This EUA will remain in effect (meaning this test can be used) for the duration of the COVID-19 declaration under Section 564(b)(1) of the Act, 21 U.S.C. section 360bbb-3(b)(1), unless the authorization is terminated or revoked sooner.  Performed at [facility], [address] [place], [place] [address]     Labs: CBC: Recent Labs  Lab 02/14/23 [phone] 02/15/23 0505 02/16/23 0615 02/17/23 0545 02/18/23 0503  WBC 5.7 5.5 5.9 4.7 11.0*  HGB 13.1 12.9 13.3 13.9 12.5  HCT 38.4 38.4 39.3 39.9 36.9  MCV 89.1 88.5 88.9 86.9 89.1  PLT 190 181 189 221 219   Basic Metabolic Panel: Recent Labs  Lab 02/14/23 0621 02/15/23 0505 02/16/23 0615 02/17/23 0545 02/18/23 0503  02/18/23 1653 02/19/23 0436  NA 139 136 138 134* 135  --   --   K 3.9 3.7 3.5 3.7 3.7  --   --   CL 109 108 106 104 105  --   --   CO2 26 24 25 22 23   --   --   GLUCOSE 89 92 97 163* 159* 186* 146*  BUN 8 12 12 13 21   --   --   CREATININE 0.87 1.01* 1.01* 0.93 0.97  --   --   CALCIUM 8.3* 8.7* 9.0 9.4 9.2  --   --   MG 2.3 2.1 2.2 2.1 2.4  --   --    Liver Function Tests: Recent Labs  Lab 02/14/23 1110  ALBUMIN 3.5*   CBG: Recent Labs  Lab 02/19/23 0801 02/19/23 2143  GLUCAP 115* 149*    Discharge time spent: greater than 30 minutes.  Signed: Marrion Coy, MD [facility] 02/20/2023

## 2023-02-20 NOTE — Care Management Important Message (Signed)
Important Message  Patient Details  Name: BRITNIE SITTERLY MRN: [id number] Date of Birth: 30-Jan-1956   Medicare Important Message Given:  Yes     [name] 02/20/2023, 12:17 PM

## 2023-02-20 NOTE — Progress Notes (Signed)
Patient is not able to walk the distance required to go the bathroom, or he/she is unable to safely negotiate stairs required to access the bathroom.  A 3in1 BSC will alleviate this problem  

## 2023-02-24 LAB — OLIGOCLONAL BANDS, CSF + SERM

## 2023-03-06 ENCOUNTER — Other Ambulatory Visit: Payer: Self-pay | Admitting: Internal Medicine

## 2023-03-06 DIAGNOSIS — I2089 Other forms of angina pectoris: Secondary | ICD-10-CM

## 2023-03-06 DIAGNOSIS — R001 Bradycardia, unspecified: Secondary | ICD-10-CM

## 2023-03-21 ENCOUNTER — Encounter: Payer: Self-pay | Admitting: Physical Therapy

## 2023-03-21 ENCOUNTER — Ambulatory Visit: Payer: Medicare HMO | Attending: Infectious Diseases | Admitting: Physical Therapy

## 2023-03-21 ENCOUNTER — Other Ambulatory Visit: Payer: Self-pay

## 2023-03-21 DIAGNOSIS — R269 Unspecified abnormalities of gait and mobility: Secondary | ICD-10-CM | POA: Insufficient documentation

## 2023-03-21 DIAGNOSIS — R2689 Other abnormalities of gait and mobility: Secondary | ICD-10-CM | POA: Diagnosis present

## 2023-03-21 DIAGNOSIS — R278 Other lack of coordination: Secondary | ICD-10-CM | POA: Diagnosis present

## 2023-03-21 DIAGNOSIS — R2681 Unsteadiness on feet: Secondary | ICD-10-CM | POA: Diagnosis present

## 2023-03-21 NOTE — Therapy (Unsigned)
OUTPATIENT PHYSICAL THERAPY NEURO EVALUATION   Patient Name: Felicia Dunn MRN: [id number] DOB:1956/02/29, 67 y.o., female Today's Date: 03/22/2023   PCP: Mick Sell, MD  REFERRING PROVIDER:    Mick Sell, MD    END OF SESSION:  PT End of Session - 03/21/23 1348     Visit Number 1    Number of Visits 24    Date for PT Re-Evaluation 06/13/23    PT Start Time 1349    PT Stop Time 1430    PT Time Calculation (min) 41 min    Equipment Utilized During Treatment Gait belt    Activity Tolerance Patient tolerated treatment well             Past Medical History:  Diagnosis Date   Arthritis    right knee   Vertigo    ?orthostatic   Past Surgical History:  Procedure Laterality Date   APPENDECTOMY     CATARACT EXTRACTION W/PHACO Left 09/13/2016   Procedure: CATARACT EXTRACTION PHACO AND INTRAOCULAR LENS PLACEMENT (IOC);  Surgeon: Nevada Crane, MD;  Location: [facility];  Service: Ophthalmology;  Laterality: Left;  SYMFONY TORIC LENS LEFT   DILATION AND CURETTAGE OF UTERUS     Patient Active Problem List   Diagnosis Date Noted   Overweight (BMI 25.0-29.9) 02/18/2023   Nocturnal hypoxemia 02/17/2023   Hyponatremia 02/17/2023   Vitamin B12 deficiency 02/15/2023   CKD (chronic kidney disease) stage 2, GFR 60-89 ml/min 02/15/2023   Orthostatic hypotension 02/15/2023   Lung nodule 02/15/2023   Vitamin D deficiency 02/15/2023   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   Hyperlipidemia 02/12/2023   Complex tear of lateral meniscus of right knee as current injury 02/08/2021   Primary osteoarthritis of right knee 02/08/2021   Chronic bilateral low back pain without sciatica 12/21/2020    ONSET DATE: 02/12/2023  REFERRING DIAG: 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  Pt is most concerned about heart at this. Appointment with cardiology on 6/25   Pt accompanied by: self  PERTINENT HISTORY:   From Recent Neurology visit "67 y.o. female with a history including newly diagnosed hypotension,  who presents with son for acute onset involuntary movements in the right hemibody with extensive and normal workup. Her exam is detailed above, with notable findings including stereotyped and fully distractible large amplitude movements of the right hemibody. 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 She does not have other features consistent with a choreiform disorder. It is possible that she may have had some MRI negative infarct of the basal ganglia with some improvement and now subsequent functional disorder but this is unlikely. There were no other metabolic or vascular causes identified during her initial presentation in the hospital. 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    PAIN:  Are you having pain? No  PRECAUTIONS: None  WEIGHT BEARING RESTRICTIONS: No  FALLS: Has patient fallen in last 6 months? No and Yes. Number of falls 1   LIVING ENVIRONMENT: Lives with: lives with their family and lives with their spouse Lives in: House/apartment Stairs: Yes: External: 4 or 4  steps; bilateral but cannot reach both Has following equipment at home: Single point cane  and has walker but does not use  PLOF: Independent and Independent with basic ADLs  PATIENT GOALS:  Walk without cane or walker. Stop movement in R arm and leg   OBJECTIVE:   DIAGNOSTIC FINDINGS:    EXAM: CT HEAD WITHOUT CONTRAST IMPRESSION: 1. 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 2. Atrophy and white matter changes. 3. Calcified atherosclerotic changes in the intracranial carotids.   EXAM: MRI HEAD WITHOUT AND WITH CONTRAST MPRESSION: 1. Stable brain MRI.  No acute intracranial abnormality. 2. Atrophy with mild chronic small vessel ischemic disease, with a few scattered remote lacunar infarcts about the thalami and left cerebellum.   EXAM: DIAGNOSTIC LUMBAR PUNCTURE UNDER FLUOROSCOPIC GUIDANCE IMPRESSION: Technically successful L3-L4 lumbar puncture yielding 13 mL of clear CSF for laboratory studies.     EXAM: CT ABDOMEN AND PELVIS WITH CONTRAST  IMPRESSION: 1. No evidence of  malignancy in the abdomen or pelvis. 2. Small right lower lobe pulmonary nodule measuring 4 mm, unchanged in size when compared with 2011 prior and considered benign given multi year stability. 3. Severe aortic Atherosclerosis (ICD10-I70.0) with multifocal areas of moderate luminal narrowing.     COGNITION: Overall cognitive status: Within functional limits for tasks assessed   SENSATION: WFL  COORDINATION: Mild athetoid movement in the R hand with finger to nose.  Ankle to knee: limited symmetrical ROM in BLE, slow and deliberate.   EDEMA:  WFL  MUSCLE TONE: WFL  MUSCLE LENGTH: WFL  DTRs:  Not tested.   POSTURE: rounded shoulders, forward head, and decreased lumbar lordosis  LOWER EXTREMITY ROM:     Active  Right Eval Left Eval  Hip flexion Lacking 10% Lacking 10%   Hip extension    Hip abduction [facility]  Hip adduction [facility]  Hip internal rotation    Hip external rotation    Knee flexion [facility]  Knee extension [facility]   Ankle dorsiflexion [facility]  Ankle plantarflexion [facility] WFL  Ankle inversion    Ankle eversion     (Blank rows = not tested)  LOWER EXTREMITY MMT:    MMT Right Eval Left Eval  Hip flexion 4- 4-  Hip extension 4 4  Hip abduction 4 4  Hip adduction 4+ 4+  Hip internal rotation    Hip external rotation    Knee flexion 4+ 4+  Knee extension 5 5  Ankle dorsiflexion 4+ 4+   Ankle plantarflexion    Ankle inversion    Ankle eversion    (Blank rows = not tested)  BED MOBILITY:  Sit to supine Complete Independence  TRANSFERS: Assistive device utilized: None  Sit to stand: Min A Stand to sit: CGA Chair to chair: CGA Floor:  not performed at eval     CURB:  Level of Assistance: Modified independence Assistive device utilized: Single point cane Curb Comments: heavy use of SPC  STAIRS: Level of Assistance: SBA Stair Negotiation Technique: Alternating Pattern  with Bilateral Rails Number of Stairs: 4   Height of Stairs: 6  Comments: posterior bias with heavy use of BUE  GAIT: Gait pattern:  intermittent coordination deficits resulting in adduction and narrow BOS Distance walked: 9ft  Assistive device utilized: None Level of assistance: SBA Comments: pt noted to hold SPC in RUE, despite reports of coordination deficits in RLE   FUNCTIONAL TESTS:  5 times sit to stand: 19.59 with UE support; unable to perform from standard seat without UE support   Timed up and go (TUG): 16.94sec  10 meter walk test: 13.44sec  Berg Balance Scale: TBD Functional gait assessment: TBD  PATIENT SURVEYS:  FOTO 58  TODAY'S TREATMENT:                                                                                                                              DATE: 03/22/2023     PATIENT EDUCATION: Education details: POC.  Person educated: Patient Education method: Medical illustrator Education comprehension: verbalized understanding  HOME EXERCISE PROGRAM: To be given at  next treatment   GOALS: Goals reviewed with patient? Yes   SHORT TERM GOALS: Target date: 04/25/2023    Patient will be independent in home exercise program to improve strength/mobility for better functional independence with ADLs. Baseline: Goal status: INITIAL   LONG TERM GOALS: Target date: 06/13/2023    Patient will increase FOTO score to equal to or greater than  67   to demonstrate statistically significant improvement in mobility and quality of life.  Baseline:  Goal status: INITIAL  2.  Patient (> 38 years old) will complete five times sit to stand test in < 15 seconds indicating an increased LE strength and improved balance. Baseline: 19.59 Goal status: INITIAL  3.  Patient will increase Berg Balance score by > 6 points to demonstrate decreased fall risk during functional activities Baseline: to bed completed at later date Goal status: INITIAL  4.  Patient will increase 10 meter walk test to >1.16m/s as to improve gait speed for better community ambulation and to reduce fall risk. Baseline: 13.44sec 0.52m/s Goal status: INITIAL  5.  Patient will  reduce timed up and go to <11 seconds to reduce fall risk and demonstrate improved transfer/gait ability. Baseline: 16.94 Goal status: INITIAL  6.  Patient will increase dynamic gait index score to >19/24 as to demonstrate reduced fall risk and improved dynamic gait balance for better safety with community/home ambulation.   Baseline: to be completed at later date  Goal status: INITIAL   ASSESSMENT:  CLINICAL IMPRESSION: Patient is a 67 y.o. female who was seen today for physical therapy evaluation and treatment for balance and coordination deficits secondary to CVA and functional movement disorder.  Patient demonstrates mild coordination deficits and The right upper extremity and right lower extremity as well as unsteadiness of gait.  Balance deficits evidenced by 5 times sit to stand 19.59 seconds 16.94 seconds 10 m walk  test 0.7 m/s.  Patient will benefit from skilled physical therapy to improve gait pattern reduce fall risk improve balance strategies and improve coordination, to return to prior level of function and improve quality of life.  OBJECTIVE IMPAIRMENTS: Abnormal gait, decreased balance, decreased coordination, decreased endurance, decreased mobility, difficulty walking, and postural dysfunction.   ACTIVITY LIMITATIONS: carrying, lifting, standing, squatting, bathing, and dressing  PARTICIPATION LIMITATIONS: meal prep, cleaning, laundry, medication management, interpersonal relationship, driving, shopping, community activity, occupation, and yard work  PERSONAL FACTORS: Age, Behavior pattern, Fitness, and 1 comorbidity: CVA  are also affecting patient's functional outcome.   REHAB POTENTIAL: Excellent  CLINICAL DECISION MAKING: Stable/uncomplicated  EVALUATION COMPLEXITY: Moderate  PLAN:  PT FREQUENCY: 1-2x/week  PT DURATION: 12 weeks  PLANNED INTERVENTIONS: Therapeutic exercises, Therapeutic activity, Neuromuscular re-education, Balance training, Gait training, Patient/Family education, Self Care, Joint mobilization, Stair training, Vestibular training, DME instructions, Cognitive remediation, Electrical stimulation, Wheelchair mobility training, Spinal manipulation, Spinal mobilization, Cryotherapy, Manual therapy, and Re-evaluation  PLAN FOR NEXT SESSION:   BERG and FGA  6 meter walk test.  Provide of balance HEP.     Golden Pop, PT 03/22/2023, 8:39 AM

## 2023-03-27 ENCOUNTER — Ambulatory Visit
Admission: RE | Admit: 2023-03-27 | Discharge: 2023-03-27 | Disposition: A | Discharge status: Home / Self Care | Payer: Medicare HMO | Source: Ambulatory Visit | Attending: Infectious Diseases | Admitting: Infectious Diseases

## 2023-03-27 ENCOUNTER — Ambulatory Visit: Payer: Medicare HMO | Admitting: Physical Therapy

## 2023-03-27 DIAGNOSIS — Z1231 Encounter for screening mammogram for malignant neoplasm of breast: Secondary | ICD-10-CM | POA: Diagnosis present

## 2023-03-27 DIAGNOSIS — R278 Other lack of coordination: Secondary | ICD-10-CM

## 2023-03-27 DIAGNOSIS — R2681 Unsteadiness on feet: Secondary | ICD-10-CM | POA: Diagnosis not present

## 2023-03-27 DIAGNOSIS — R269 Unspecified abnormalities of gait and mobility: Secondary | ICD-10-CM

## 2023-03-27 DIAGNOSIS — R2689 Other abnormalities of gait and mobility: Secondary | ICD-10-CM

## 2023-03-27 NOTE — Therapy (Signed)
OUTPATIENT PHYSICAL THERAPY NEURO TREATMENT    Patient Name: Felicia Dunn MRN: [id number] DOB:18-Nov-1955, 67 y.o., female Today's Date: 03/27/2023   PCP: Mick Sell, MD  REFERRING PROVIDER:    Mick Sell, MD    END OF SESSION:  PT End of Session - 03/27/23 1438     Visit Number 2    Number of Visits 24    Date for PT Re-Evaluation 06/13/23    PT Start Time 1435    PT Stop Time 1517    PT Time Calculation (min) 42 min    Equipment Utilized During Treatment Gait belt    Activity Tolerance Patient tolerated treatment well             Past Medical History:  Diagnosis Date   Arthritis    right knee   Vertigo    ?orthostatic   Past Surgical History:  Procedure Laterality Date   APPENDECTOMY     CATARACT EXTRACTION W/PHACO Left 09/13/2016   Procedure: CATARACT EXTRACTION PHACO AND INTRAOCULAR LENS PLACEMENT (IOC);  Surgeon: Nevada Crane, MD;  Location: [facility];  Service: Ophthalmology;  Laterality: Left;  SYMFONY TORIC LENS LEFT   DILATION AND CURETTAGE OF UTERUS     Patient Active Problem List   Diagnosis Date Noted   Overweight (BMI 25.0-29.9) 02/18/2023   Nocturnal hypoxemia 02/17/2023   Hyponatremia 02/17/2023   Vitamin B12 deficiency 02/15/2023   CKD (chronic kidney disease) stage 2, GFR 60-89 ml/min 02/15/2023   Orthostatic hypotension 02/15/2023   Lung nodule 02/15/2023   Vitamin D deficiency 02/15/2023   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   Hyperlipidemia 02/12/2023   Complex tear of lateral meniscus of right knee as current injury 02/08/2021   Primary osteoarthritis of right knee 02/08/2021   Chronic bilateral low back pain without sciatica 12/21/2020    ONSET DATE: 02/12/2023  REFERRING DIAG: 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  Pt is most concerned about heart at this. Appointment with cardiology on 6/25   Pt accompanied by: self  PERTINENT HISTORY:   From Recent Neurology visit "67 y.o. female with a history including newly diagnosed  hypotension, who presents with son for acute onset involuntary movements in the right hemibody with extensive and normal workup. Her exam is detailed above, with notable findings including stereotyped and fully distractible large amplitude movements of the right hemibody. 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 She does not have other features consistent with a choreiform disorder. It is possible that she may have had some MRI negative infarct of the basal ganglia with some improvement and now subsequent functional disorder but this is unlikely. There were no other metabolic or vascular causes identified during her initial presentation in the hospital. 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    PAIN:  Are you having pain? No  PRECAUTIONS: None  WEIGHT BEARING RESTRICTIONS: No  FALLS: Has patient fallen in last 6 months? No and Yes. Number of falls 1   LIVING ENVIRONMENT: Lives with: lives with their family and lives with their spouse Lives in: House/apartment Stairs: Yes: External: 4 or 4  steps; bilateral but cannot reach both Has following equipment at home: Single point cane  and has walker but does not use  PLOF: Independent and Independent with basic ADLs  PATIENT GOALS:  Walk without cane or walker. Stop movement in R arm and leg   OBJECTIVE:   DIAGNOSTIC FINDINGS:    EXAM: CT HEAD WITHOUT CONTRAST IMPRESSION: 1. 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 2. Atrophy and white matter changes. 3. Calcified atherosclerotic changes in the intracranial carotids.   EXAM: MRI HEAD WITHOUT AND WITH CONTRAST MPRESSION: 1. Stable brain MRI.  No acute intracranial abnormality. 2. Atrophy with mild chronic small vessel ischemic disease, with a few scattered remote lacunar infarcts about the thalami and left cerebellum.   EXAM: DIAGNOSTIC LUMBAR PUNCTURE UNDER FLUOROSCOPIC GUIDANCE IMPRESSION: Technically successful L3-L4 lumbar puncture yielding 13 mL of clear CSF for laboratory studies.     EXAM: CT ABDOMEN AND PELVIS WITH CONTRAST  IMPRESSION: 1. No evidence of  malignancy in the abdomen or pelvis. 2. Small right lower lobe pulmonary nodule measuring 4 mm, unchanged in size when compared with 2011 prior and considered benign given multi year stability. 3. Severe aortic Atherosclerosis (ICD10-I70.0) with multifocal areas of moderate luminal narrowing.     COGNITION: Overall cognitive status: Within functional limits for tasks assessed   SENSATION: WFL  COORDINATION: Mild athetoid movement in the R hand with finger to nose.  Ankle to knee: limited symmetrical ROM in BLE, slow and deliberate.   EDEMA:  WFL  MUSCLE TONE: WFL  MUSCLE LENGTH: WFL  DTRs:  Not tested.   POSTURE: rounded shoulders, forward head, and decreased lumbar lordosis  LOWER EXTREMITY ROM:     Active  Right Eval Left Eval  Hip flexion Lacking 10% Lacking 10%   Hip extension    Hip abduction [facility]  Hip adduction [facility]  Hip internal rotation    Hip external rotation    Knee flexion [facility] [address] [facility]  Knee extension [facility]   Ankle dorsiflexion [facility]  Ankle plantarflexion [facility] WFL  Ankle inversion    Ankle eversion     (Blank rows = not tested)  LOWER EXTREMITY MMT:    MMT Right Eval Left Eval  Hip flexion 4- 4-  Hip extension 4 4  Hip abduction 4 4  Hip adduction 4+ 4+  Hip internal rotation    Hip external rotation    Knee flexion 4+ 4+  Knee extension 5 5  Ankle dorsiflexion 4+ 4+   Ankle plantarflexion    Ankle inversion    Ankle eversion    (Blank rows = not tested)  BED MOBILITY:  Sit to supine Complete Independence  TRANSFERS: Assistive device utilized: None  Sit to stand: Min A Stand to sit: CGA Chair to chair: CGA Floor:  not performed at eval     CURB:  Level of Assistance: Modified independence Assistive device utilized: Single point cane Curb Comments: heavy use of SPC  STAIRS: Level of Assistance: SBA Stair Negotiation Technique: Alternating Pattern  with Bilateral Rails Number of Stairs: 4   Height of Stairs: 6  Comments: posterior bias with heavy use of BUE  GAIT: Gait pattern:  intermittent coordination deficits resulting in adduction and narrow BOS Distance walked: 31ft  Assistive device utilized: None Level of assistance: SBA Comments: pt noted to hold SPC in RUE, despite reports of coordination deficits in RLE   FUNCTIONAL TESTS:  5 times sit to stand: 19.59 with UE support; unable to perform from standard seat without UE support   Timed up and go (TUG): 16.94sec  10 meter walk test: 13.44sec  Berg Balance Scale: TBD Functional gait assessment: TBD  PATIENT SURVEYS:  FOTO 58  TODAY'S TREATMENT:                                                                                                                              DATE: 03/27/2023    Patient demonstrates increased fall risk as noted by score of  41 /56 on Berg Balance Scale.  (<36= high risk for falls, close to 100%; 37-45 significant >80%; 46-51 moderate >50%; 52-55 lower >25%)   Patient  demonstrates increased fall risk as noted by score of 22/30 on  Functional Gait Assessment.   <22/30 = predictive of falls, <20/30 = fall in 6 months, <18/30 = predictive of falls in PD MCID: 5 points stroke population, 4 points geriatric population (ANPTA Core Set of Outcome Measures for Adults with Neurologic Conditions, 2018)  6 Min Walk Test:  Instructed patient to ambulate as quickly and as safely as possible for 6 minutes using LRAD. Patient was allowed to take standing rest breaks without stopping the test, but if the patient required a sitting rest break the clock would be stopped and the test would be over.  Results: 882 feet using no AD with supervision assist. Results indicate that the patient has reduced endurance with ambulation compared to age matched norms.  Age Matched Norms: 35-69 yo M: 46 F: 29, 20-79 yo M: 27 F: 471, 25-89 yo M: 417 F: 392 MDC: 58.21 meters (190.98 feet) or 50 meters (ANPTA Core Set of Outcome Measures for Adults with Neurologic  Conditions, 2018)  Balance HEP provided by PT. See below for details.    PATIENT EDUCATION: Education details: POC.  Person educated: Patient Education method: Medical illustrator Education comprehension: verbalized understanding  HOME EXERCISE PROGRAM: Access Code: [email or web address] URL: [email or web address] Date: 03/27/2023 Prepared by: Grier Rocher  Exercises - Standing Tandem Balance with Counter Support  - 1 x daily - 5 x weekly - 2 sets - 5 reps - 10 hold - Standing Single Leg Stance with Counter Support  - 1 x daily - 5 x weekly - 2 sets - 5 reps - 10 hold  GOALS: Goals reviewed with patient? Yes   SHORT TERM GOALS: Target date: 04/25/2023    Patient will be independent in home exercise program to improve strength/mobility for better functional independence with ADLs. Baseline: Goal status: INITIAL   LONG TERM GOALS: Target date: 06/13/2023    Patient will increase FOTO score to equal to  or greater than  67   to demonstrate statistically significant improvement in mobility and quality of life.  Baseline:  Goal status: INITIAL  2.  Patient (> 77 years old) will complete five times sit to stand test in < 15 seconds indicating an increased LE strength and improved balance. Baseline: 19.59 Goal status: INITIAL  3.  Patient will increase Berg Balance score by > 6 points to demonstrate decreased fall risk during functional activities Baseline: 41/56 Goal status: INITIAL  4.  Patient will increase 10 meter walk test to >1.22m/s as to improve gait speed for better community ambulation and to reduce fall risk. Baseline: 13.44sec 0.45m/s Goal status: INITIAL  5.  Patient will reduce timed up and go to <11 seconds to reduce fall risk and demonstrate improved transfer/gait ability. Baseline: 16.94 Goal status: INITIAL  6.  Patient will increase FGA score by 4 point to demonstrate reduced fall risk and improved dynamic gait balance for better safety with community/home ambulation.   Baseline: 22/30  Goal status: INITIAL     ASSESSMENT:  CLINICAL IMPRESSION: Patient presented to physical therapy for management of balance disorder.  Reports excellent effort throughout therapy session.  Patient continues to demonstrate increased fall risk with reduced score on 6-minute walk test, Berg balance scale, and FGA.  HEP provided by PT for improved static balance as listed above.  Patient will benefit from skilled physical therapy to improve gait pattern reduce fall risk improve balance strategies and improve coordination, to return to prior level of function and improve quality of life.  OBJECTIVE IMPAIRMENTS: Abnormal gait, decreased balance, decreased coordination, decreased endurance, decreased mobility, difficulty walking, and postural dysfunction.   ACTIVITY LIMITATIONS: carrying, lifting, standing, squatting, bathing, and dressing  PARTICIPATION LIMITATIONS: meal prep, cleaning,  laundry, medication management, interpersonal relationship, driving, shopping, community activity, occupation, and yard work  PERSONAL FACTORS: Age, Behavior pattern, Fitness, and 1 comorbidity: CVA  are also affecting patient's functional outcome.   REHAB POTENTIAL: Excellent  CLINICAL DECISION MAKING: Stable/uncomplicated  EVALUATION COMPLEXITY: Moderate  PLAN:  PT FREQUENCY: 1-2x/week  PT DURATION: 12 weeks  PLANNED INTERVENTIONS: Therapeutic exercises, Therapeutic activity, Neuromuscular re-education, Balance training, Gait training, Patient/Family education, Self Care, Joint mobilization, Stair training, Vestibular training, DME instructions, Cognitive remediation, Electrical stimulation, Wheelchair mobility training, Spinal manipulation, Spinal mobilization, Cryotherapy, Manual therapy, and Re-evaluation  PLAN FOR NEXT SESSION:   Dynamic balance Dual task Endurance training Bilateral lower extremity strengthening  Golden Pop, PT 03/27/2023, 3:21 PM    Note: Portions of this document were prepared using Dragon voice recognition software  and although reviewed may contain unintentional dictation errors in syntax, grammar, or spelling.

## 2023-03-29 ENCOUNTER — Ambulatory Visit: Payer: Medicare HMO

## 2023-03-29 ENCOUNTER — Telehealth (HOSPITAL_COMMUNITY): Payer: Self-pay | Admitting: Emergency Medicine

## 2023-03-29 DIAGNOSIS — R079 Chest pain, unspecified: Secondary | ICD-10-CM

## 2023-03-29 MED ORDER — IVABRADINE HCL 5 MG PO TABS
15.0000 mg | ORAL_TABLET | Freq: Once | ORAL | 0 refills | Status: AC
Start: 2023-03-29 — End: 2023-03-29

## 2023-03-29 NOTE — Telephone Encounter (Signed)
Attempted to call patient regarding upcoming cardiac CT appointment. Left message on voicemail with name and callback number Rockwell Alexandria RN Navigator Cardiac Imaging [facility] [phone] Office [phone] Cell  15mg  ivabradine sent to pharm on file

## 2023-03-29 NOTE — Telephone Encounter (Signed)
Reaching out to patient to offer assistance regarding upcoming cardiac imaging study; pt verbalizes understanding of appt date/time, parking situation and where to check in, pre-test NPO status and medications ordered, and verified current allergies; name and call back number provided for further questions should they arise Aanika Defoor RN Navigator Cardiac Imaging [facility] [phone] office [phone] cell 

## 2023-03-30 ENCOUNTER — Ambulatory Visit
Admission: RE | Admit: 2023-03-30 | Discharge: 2023-03-30 | Disposition: A | Discharge status: Home / Self Care | Payer: Medicare HMO | Source: Ambulatory Visit | Attending: Internal Medicine | Admitting: Internal Medicine

## 2023-03-30 DIAGNOSIS — I2089 Other forms of angina pectoris: Secondary | ICD-10-CM | POA: Insufficient documentation

## 2023-03-30 DIAGNOSIS — I25118 Atherosclerotic heart disease of native coronary artery with other forms of angina pectoris: Secondary | ICD-10-CM | POA: Diagnosis not present

## 2023-03-30 DIAGNOSIS — R001 Bradycardia, unspecified: Secondary | ICD-10-CM | POA: Diagnosis present

## 2023-03-30 MED ORDER — METOPROLOL TARTRATE 5 MG/5ML IV SOLN
5.0000 mg | Freq: Once | INTRAVENOUS | Status: AC
  Administered 2023-03-30: 5 mg via INTRAVENOUS

## 2023-03-30 MED ORDER — NITROGLYCERIN 0.4 MG SL SUBL
0.8000 mg | SUBLINGUAL_TABLET | Freq: Once | SUBLINGUAL | Status: AC
  Administered 2023-03-30: 0.8 mg via SUBLINGUAL

## 2023-03-30 MED ORDER — IOHEXOL 350 MG/ML SOLN
75.0000 mL | Freq: Once | INTRAVENOUS | Status: AC | PRN
  Administered 2023-03-30: 75 mL via INTRAVENOUS

## 2023-03-30 NOTE — Progress Notes (Signed)
Patient tolerated CT well. Drank water after. Vital signs stable encourage to drink water throughout day.Reasons explained and verbalized understanding.  

## 2023-04-03 ENCOUNTER — Ambulatory Visit: Payer: Medicare HMO

## 2023-04-03 ENCOUNTER — Ambulatory Visit: Payer: Medicare HMO | Admitting: Cardiology

## 2023-04-05 ENCOUNTER — Ambulatory Visit: Payer: Medicare HMO | Admitting: Physical Therapy

## 2023-04-10 ENCOUNTER — Ambulatory Visit: Payer: Medicare HMO | Admitting: Physical Therapy

## 2023-04-13 ENCOUNTER — Ambulatory Visit: Payer: Medicare HMO | Admitting: Physical Therapy

## 2023-04-17 ENCOUNTER — Ambulatory Visit: Payer: Medicare HMO | Attending: Infectious Diseases

## 2023-04-19 ENCOUNTER — Ambulatory Visit: Payer: Medicare HMO | Admitting: Physical Therapy

## 2023-04-24 ENCOUNTER — Ambulatory Visit: Payer: Medicare HMO

## 2023-04-26 ENCOUNTER — Ambulatory Visit: Payer: Medicare HMO

## 2023-05-02 ENCOUNTER — Ambulatory Visit: Payer: Medicare HMO

## 2023-05-04 ENCOUNTER — Ambulatory Visit: Payer: Medicare HMO | Admitting: Physical Therapy

## 2023-05-08 ENCOUNTER — Ambulatory Visit: Payer: Medicare HMO

## 2023-05-10 ENCOUNTER — Ambulatory Visit: Payer: Medicare HMO

## 2023-05-16 ENCOUNTER — Ambulatory Visit: Payer: Medicare HMO

## 2023-05-18 ENCOUNTER — Ambulatory Visit: Payer: Medicare HMO

## 2023-05-22 ENCOUNTER — Ambulatory Visit: Payer: Medicare HMO

## 2023-05-25 ENCOUNTER — Ambulatory Visit: Payer: Medicare HMO

## 2023-05-30 ENCOUNTER — Ambulatory Visit: Payer: Medicare HMO

## 2023-06-01 ENCOUNTER — Ambulatory Visit: Payer: Medicare HMO

## 2023-06-06 ENCOUNTER — Ambulatory Visit: Payer: Medicare HMO

## 2024-03-18 IMAGING — MG MM DIGITAL SCREENING BILAT W/ TOMO AND CAD
6 of 10 series · 6 of 30 positions shown · non-contrast
Comparison: Previous exam(s).

CLINICAL DATA: Screening.

EXAM:
DIGITAL SCREENING BILATERAL MAMMOGRAM WITH TOMOSYNTHESIS AND CAD
TECHNIQUE: Bilateral screening digital craniocaudal and mediolateral oblique
mammograms were obtained. Bilateral screening digital breast
tomosynthesis was performed. The images were evaluated with
computer-aided detection.

[R CC synth-2D (1 of 2)]
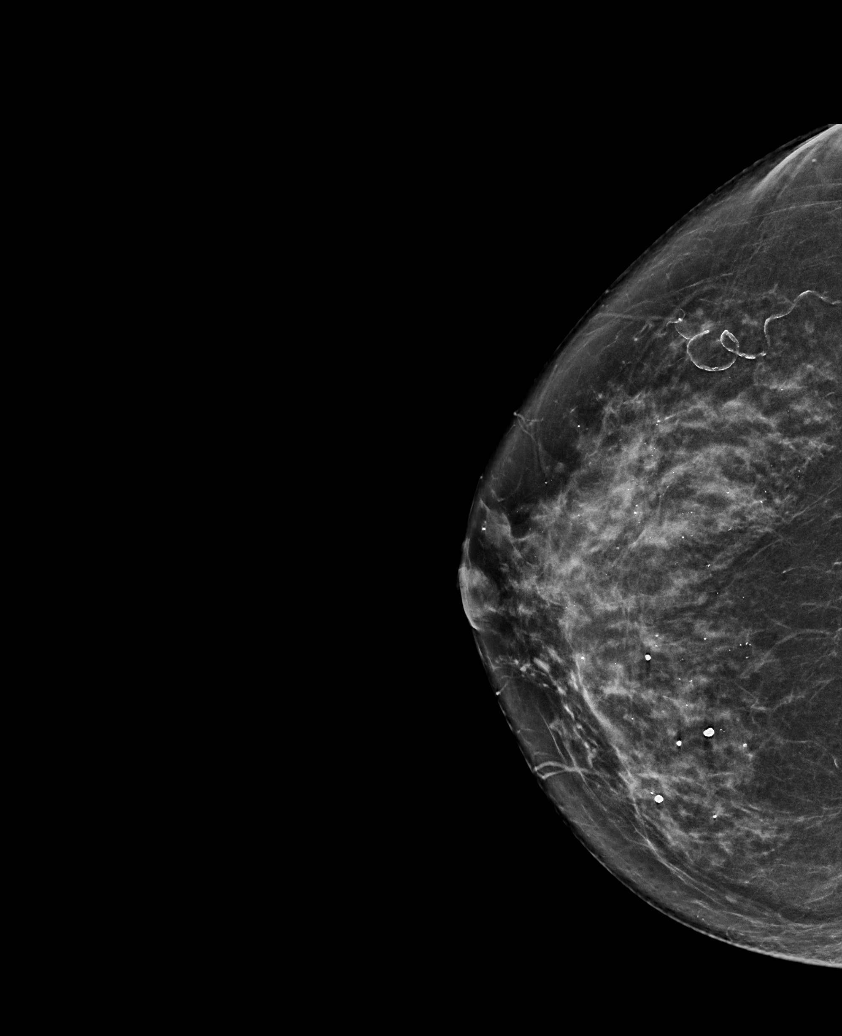

[R CC synth-2D (2 of 2)]
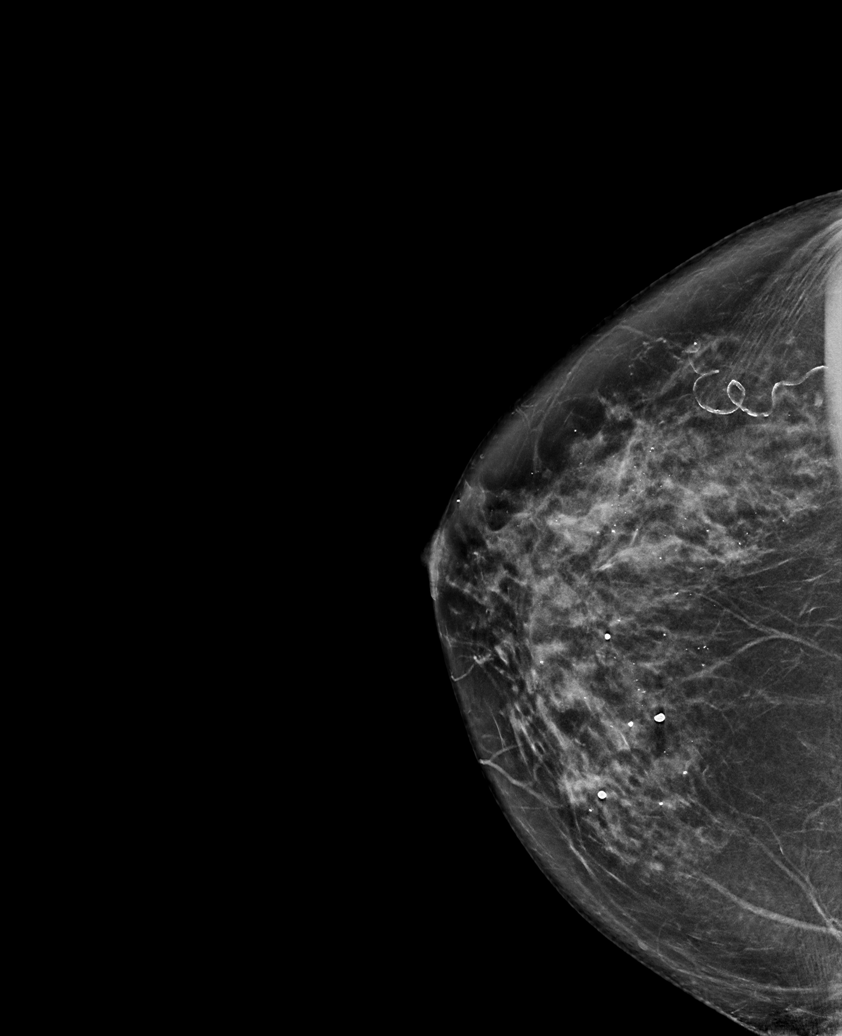

[L MLO synth-2D]
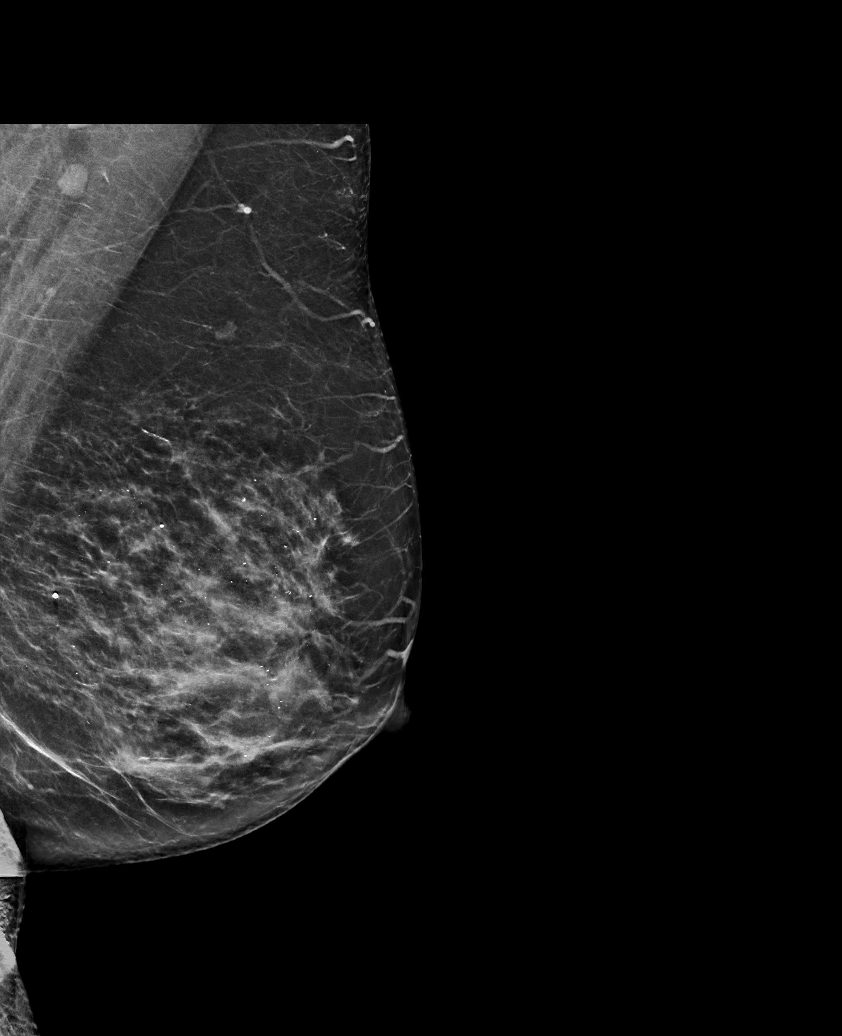

[L CC synth-2D]
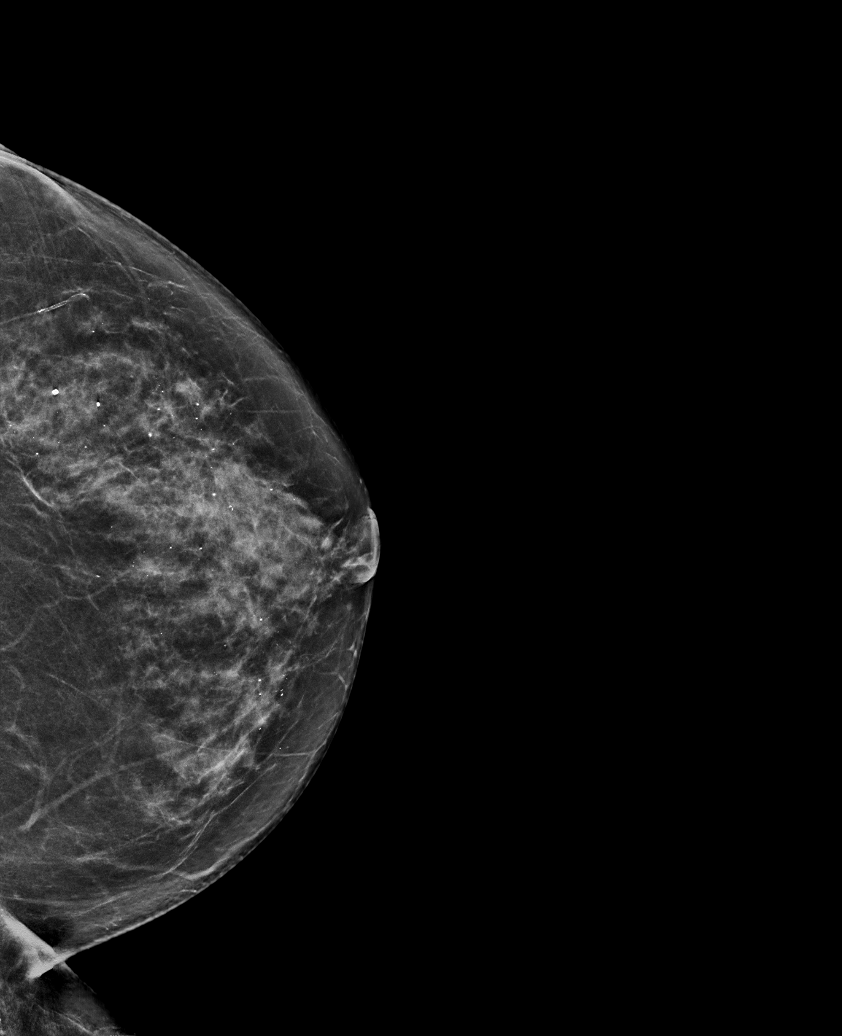

[R MLO synth-2D]
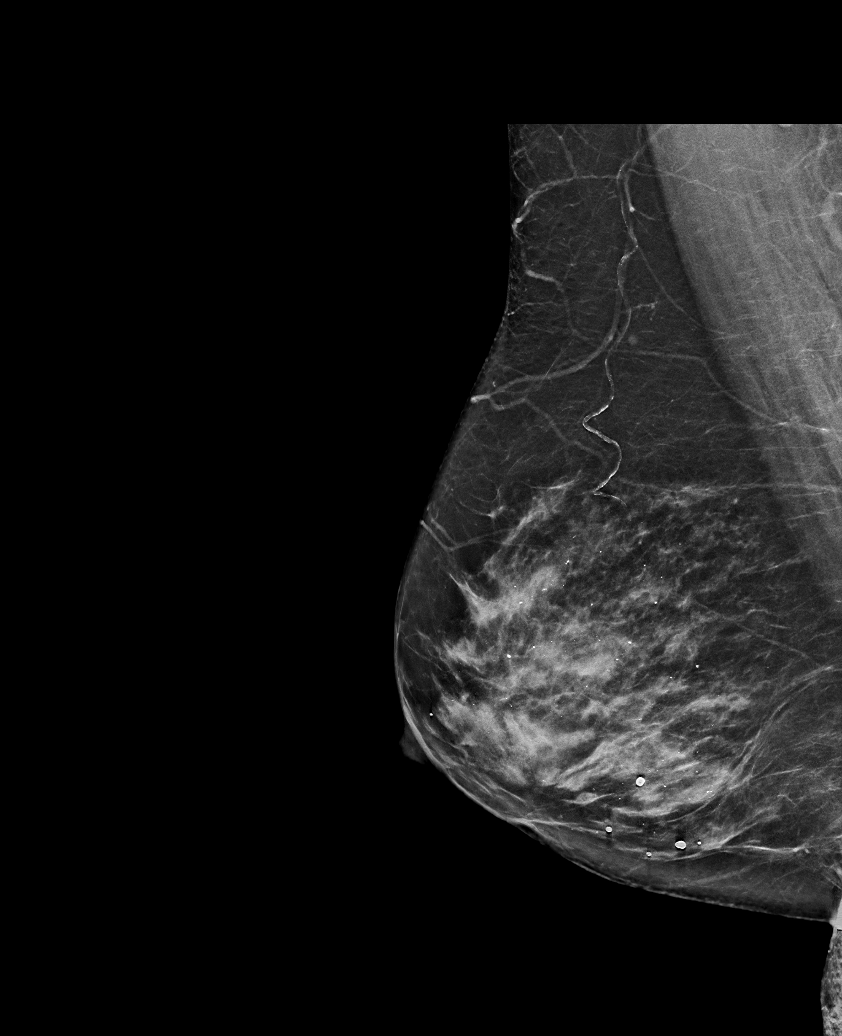

[R CC tomo · tomo slice 35/68.0]
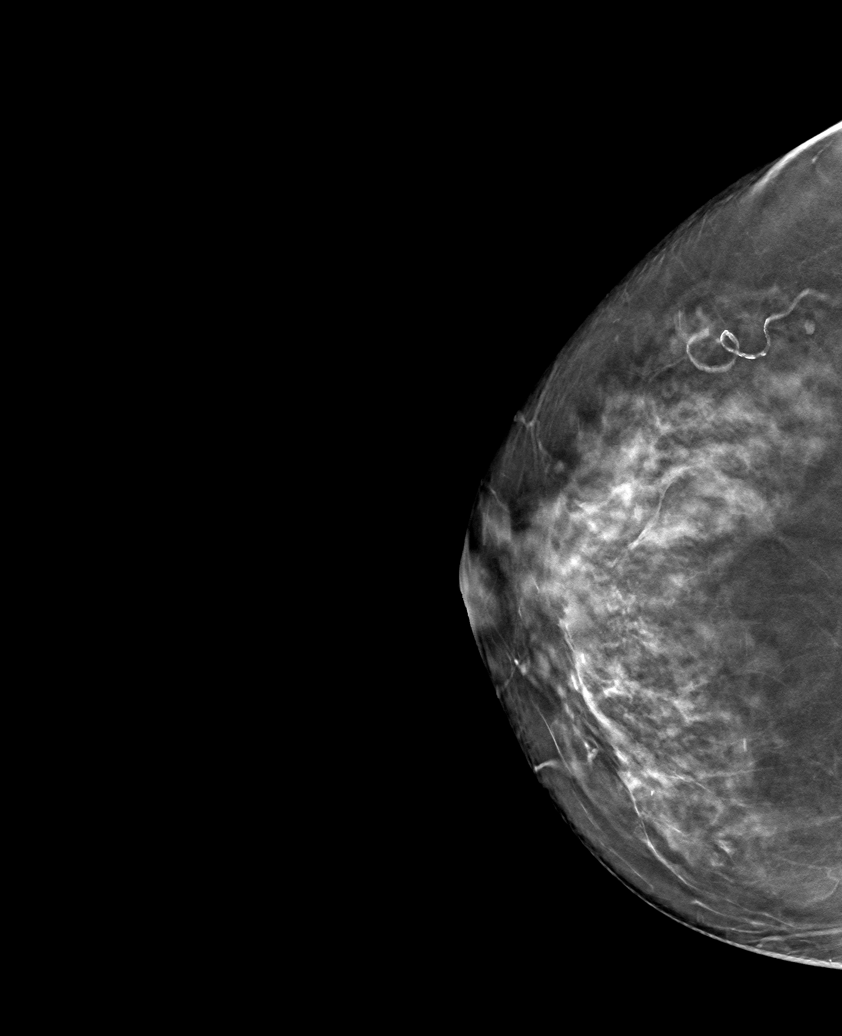

[6 of 30 positions shown; findings below may reference images not displayed]

ACR Breast Density Category c: The breast tissue is heterogeneously
dense, which may obscure small masses.
FINDINGS: There are no findings suspicious for malignancy.
IMPRESSION: No mammographic evidence of malignancy. A result letter of this
screening mammogram will be mailed directly to the patient.

RECOMMENDATION:
Screening mammogram in one year. (Code:[id number])

BI-RADS CATEGORY  1: Negative.

## 2024-10-22 ENCOUNTER — Ambulatory Visit: Admitting: Urology

## 2024-10-23 ENCOUNTER — Other Ambulatory Visit: Payer: Self-pay | Admitting: Infectious Diseases

## 2024-10-23 DIAGNOSIS — N3941 Urge incontinence: Secondary | ICD-10-CM

## 2024-10-23 DIAGNOSIS — N390 Urinary tract infection, site not specified: Secondary | ICD-10-CM

## 2024-10-30 ENCOUNTER — Ambulatory Visit
Admission: RE | Admit: 2024-10-30 | Discharge: 2024-10-30 | Disposition: A | Discharge status: Home / Self Care | Source: Ambulatory Visit | Attending: Infectious Diseases | Admitting: Infectious Diseases

## 2024-10-30 DIAGNOSIS — N3941 Urge incontinence: Secondary | ICD-10-CM | POA: Insufficient documentation

## 2024-10-30 DIAGNOSIS — N390 Urinary tract infection, site not specified: Secondary | ICD-10-CM | POA: Diagnosis present

## 2024-10-30 DIAGNOSIS — K449 Diaphragmatic hernia without obstruction or gangrene: Secondary | ICD-10-CM | POA: Diagnosis not present

## 2024-11-06 ENCOUNTER — Ambulatory Visit: Admitting: Urology

## 2024-11-06 ENCOUNTER — Encounter: Payer: Self-pay | Admitting: Urology

## 2024-11-06 VITALS — BP 140/79 | HR 66 | Ht 77.0 in | Wt 228.0 lb

## 2024-11-06 DIAGNOSIS — N390 Urinary tract infection, site not specified: Secondary | ICD-10-CM

## 2024-11-06 LAB — URINALYSIS, COMPLETE
Bilirubin, UA: NEGATIVE
Glucose, UA: NEGATIVE
Ketones, UA: NEGATIVE
Nitrite, UA: NEGATIVE
Protein,UA: NEGATIVE
Specific Gravity, UA: <1.005 — ABNORMAL LOW (ref 1.005–1.030)
Urobilinogen, Ur: 0.2 mg/dL (ref 0.2–1.0)
pH, UA: 6 (ref 5.0–7.5)

## 2024-11-06 LAB — MICROSCOPIC EXAMINATION: WBC, UA: >30 /HPF — AB (ref 0–5)

## 2024-11-06 LAB — BLADDER SCAN AMB NON-IMAGING: Scan Result: 63

## 2024-11-06 MED ORDER — ESTRADIOL 0.01 % VA CREA: TOPICAL_CREAM | VAGINAL | 0 refills | Status: AC

## 2024-11-06 MED ORDER — NITROFURANTOIN MONOHYD MACRO 100 MG PO CAPS: 100.0000 mg | ORAL_CAPSULE | Freq: Two times a day (BID) | ORAL | 0 refills | Status: AC

## 2024-11-06 NOTE — Progress Notes (Signed)
 "  11/06/2024 1:19 PM   Felicia Dunn 1956/07/07 [id number]  Referring provider: Epifanio Alm SQUIBB, MD [address] [place],  [place] [address]  Chief Complaint  Patient presents with   Recurrent UTI    HPI: Felicia Dunn is a 69 y.o. female referred for evaluation of recurrent UTIs.  Positive urine cultures E. coli 10/09/2024, 08/24/2024, 08/21/2024. Symptoms: Frequency, urgency, dysuria and bladder pain.  Symptoms resolved with antibiotic therapy however returned several days after completing CT renal stone study 10/30/2024 showed no abnormalities No febrile UTI/pyelonephritis No gross hematuria  PMH: Past Medical History:  Diagnosis Date   Arthritis    right knee   Vertigo    ?orthostatic    Surgical History: Past Surgical History:  Procedure Laterality Date   APPENDECTOMY     CATARACT EXTRACTION W/PHACO Left 09/13/2016   Procedure: CATARACT EXTRACTION PHACO AND INTRAOCULAR LENS PLACEMENT (IOC);  Surgeon: Felicia Oneil Novak, MD;  Location: [facility];  Service: Ophthalmology;  Laterality: Left;  SYMFONY TORIC LENS LEFT   DILATION AND CURETTAGE OF UTERUS      Home Medications:  Allergies as of 11/06/2024   No Known Allergies      Medication List        Accurate as of November 06, 2024  1:19 PM. If you have any questions, ask your nurse or doctor.          ascorbic acid 500 MG tablet Commonly known as: VITAMIN C Take 500 mg by mouth daily.   Aspirin Adult Low Dose 81 MG tablet Generic drug: aspirin EC Take 81 mg by mouth daily. What changed: Another medication with the same name was removed. Continue taking this medication, and follow the directions you see here. Changed by: Felicia Barba, MD   cyanocobalamin  2000 MCG tablet Take 0.5 tablets (1,000 mcg total) by mouth daily.   meloxicam 15 MG tablet Commonly known as: MOBIC Take 15 mg by mouth daily.   midodrine 5 MG tablet Commonly known as: PROAMATINE Take 5 mg by mouth 3  (three) times daily with meals.   rosuvastatin 20 MG tablet Commonly known as: CRESTOR Take 20 mg by mouth daily.   Vitamin D  (Ergocalciferol ) 1.25 MG (50000 UNIT) Caps capsule Commonly known as: DRISDOL  Take 1 capsule (50,000 Units total) by mouth every 7 (seven) days.   vitamin D3 25 MCG tablet Commonly known as: CHOLECALCIFEROL Take 2 tablets (2,000 Units total) by mouth daily.        Allergies: Allergies[1]  Family History: History reviewed. No pertinent family history.  Social History:  reports that she quit smoking about 11 years ago. Her smoking use included cigarettes. She started smoking about 54 years ago. She has a 64.5 pack-year smoking history. She has never used smokeless tobacco. She reports that she does not currently use alcohol. She reports that she does not use drugs.   Physical Exam: BP (!) 140/79   Pulse 66   Ht 6' 5 (1.956 m)   Wt 228 lb (103.4 kg)   BMI 27.04 kg/m   Constitutional:  Alert, No acute distress. HEENT: Felicia Dunn AT Respiratory: Normal respiratory effort, no increased work of breathing. Psychiatric: Normal mood and affect.  Laboratory Data:  Urinalysis Dipstick 3+ leukocytes/nitrite negative/trace blood Microscopy >30 WBC   Pertinent Imaging: CT images were personally reviewed and interpreted  CT RENAL STONE STUDY  Narrative EXAM: CT ABDOMEN AND PELVIS WITHOUT CONTRAST 10/30/2024 02:04:31 PM  TECHNIQUE: CT of the abdomen and pelvis was performed  without the administration of intravenous contrast. Multiplanar reformatted images are provided for review. Automated exposure control, iterative reconstruction, and/or weight-based adjustment of the mA/kV was utilized to reduce the radiation dose to as low as reasonably achievable.  COMPARISON: 02/19/2023  CLINICAL HISTORY: Recurrent UTI and urge incontinence.  FINDINGS:  LOWER CHEST: Unchanged 3 mm right lower lobe nodule, benign.  LIVER: The liver is  unremarkable.  GALLBLADDER AND BILE DUCTS: Gallbladder is unremarkable. No biliary ductal dilatation.  SPLEEN: No acute abnormality.  PANCREAS: No acute abnormality.  ADRENAL GLANDS: No acute abnormality.  KIDNEYS, URETERS AND BLADDER: No stones in the kidneys or ureters. No hydronephrosis. No perinephric or periureteral stranding. Partially distended urinary bladder, without focal abnormality.  GI AND BOWEL: Small hiatal hernia. Ingested material filling the stomach. Appendectomy. Short, noninflamed appendiceal stump. There is no bowel obstruction.  PERITONEUM AND RETROPERITONEUM: No ascites. No free air.  VASCULATURE: Aorta is normal in caliber. Diffuse aortoiliac atherosclerosis.  LYMPH NODES: No lymphadenopathy.  REPRODUCTIVE ORGANS: Age related atrophy of the uterus and ovaries.  BONES AND SOFT TISSUES: Decreased bony mineralization. Multilevel degenerative disc disease of the visualized spine. Moderate joint space loss of both hips, degenerative. No focal soft tissue abnormality.  IMPRESSION: 1. No acute abnormality in the abdomen or pelvis. 2. Small sliding type hiatal hernia.  Electronically signed by: Felicia Myers MD 10/30/2024 02:29 PM EST RP Workstation: [id number]   Assessment & Plan:    1. Recurrent UTI  She meets the AUA definition of recurrent UTI with 2 culture positive symptomatic infections within a 76-month period We discussed the evaluation and treatment of patients with recurrent UTIs at length.  Possible etiologies of recurrent infection include periurethral tissue atrophy in postmenopausal woman, constipation, sexual activity, incomplete emptying, anatomic abnormalities, and even genetic predisposition.  Finally, we discussed the role of perineal hygiene, timed voiding, adequate hydration, topical vaginal estrogen, cranberry prophylaxis, and low-dose antibiotic prophylaxis. Symptomatic infection today and urine culture ordered.  Based on  last culture Rx Macrobid  100 g twice daily sent to pharmacy Will start low-dose antibiotic prophylaxis x 2-3 months after she finishes her current antibiotic course Start low-dose vaginal estrogen.  I recommend she not use the applicator and apply a pea-sized amount to her fingertip or Q-tip and wipe in the vaginal roof twice weekly Start cranberry supplement PA follow-up ~2 months and call earlier for recurrent UTI symptoms   Felicia JAYSON Barba, MD  [facility] [address] [place], [place] [address] [phone]     [1] No Known Allergies  "

## 2024-11-10 LAB — CULTURE, URINE COMPREHENSIVE

## 2024-11-12 ENCOUNTER — Other Ambulatory Visit: Payer: Self-pay | Admitting: *Deleted

## 2024-11-12 ENCOUNTER — Ambulatory Visit: Payer: Self-pay | Admitting: Urology

## 2024-11-12 MED ORDER — TRIMETHOPRIM 100 MG PO TABS: 100.0000 mg | ORAL_TABLET | Freq: Every day | ORAL | 2 refills | Status: AC

## 2025-01-01 ENCOUNTER — Ambulatory Visit: Admitting: Physician Assistant
